# Patient Record
Sex: Female | Born: 1955 | Race: White | Hispanic: No | Marital: Single | Smoking: Never smoker
Health system: Southern US, Community
[De-identification: ages and names within clinical notes are randomized; demographics above are authoritative.]

## PROBLEM LIST (undated history)

## (undated) DIAGNOSIS — E119 Type 2 diabetes mellitus without complications: Secondary | ICD-10-CM

## (undated) DIAGNOSIS — I10 Essential (primary) hypertension: Secondary | ICD-10-CM

## (undated) DIAGNOSIS — N289 Disorder of kidney and ureter, unspecified: Secondary | ICD-10-CM

## (undated) DIAGNOSIS — E079 Disorder of thyroid, unspecified: Secondary | ICD-10-CM

## (undated) HISTORY — PX: CHOLECYSTECTOMY: SHX55

## (undated) HISTORY — PX: REPLACEMENT TOTAL KNEE: SUR1224

## (undated) HISTORY — PX: CARDIAC SURGERY: SHX584

## (undated) HISTORY — PX: OOPHORECTOMY: SHX86

---

## 2021-04-22 ENCOUNTER — Other Ambulatory Visit: Payer: Self-pay | Admitting: Family Medicine

## 2021-04-22 DIAGNOSIS — Z1231 Encounter for screening mammogram for malignant neoplasm of breast: Secondary | ICD-10-CM

## 2021-05-07 ENCOUNTER — Other Ambulatory Visit: Payer: Self-pay

## 2021-05-07 ENCOUNTER — Ambulatory Visit
Admission: RE | Admit: 2021-05-07 | Discharge: 2021-05-07 | Disposition: A | Discharge status: Home / Self Care | Payer: Medicare PPO | Source: Ambulatory Visit | Attending: Family Medicine | Admitting: Family Medicine

## 2021-05-07 DIAGNOSIS — Z1231 Encounter for screening mammogram for malignant neoplasm of breast: Secondary | ICD-10-CM | POA: Insufficient documentation

## 2021-06-23 ENCOUNTER — Emergency Department: Payer: Medicare PPO

## 2021-06-23 ENCOUNTER — Other Ambulatory Visit: Payer: Self-pay

## 2021-06-23 ENCOUNTER — Emergency Department
Admission: EM | Admit: 2021-06-23 | Discharge: 2021-06-23 | Disposition: A | Discharge status: Home / Self Care | Payer: Medicare PPO | Attending: Emergency Medicine | Admitting: Emergency Medicine

## 2021-06-23 DIAGNOSIS — E1122 Type 2 diabetes mellitus with diabetic chronic kidney disease: Secondary | ICD-10-CM | POA: Diagnosis not present

## 2021-06-23 DIAGNOSIS — I2510 Atherosclerotic heart disease of native coronary artery without angina pectoris: Secondary | ICD-10-CM | POA: Insufficient documentation

## 2021-06-23 DIAGNOSIS — Z7901 Long term (current) use of anticoagulants: Secondary | ICD-10-CM | POA: Insufficient documentation

## 2021-06-23 DIAGNOSIS — R197 Diarrhea, unspecified: Secondary | ICD-10-CM | POA: Insufficient documentation

## 2021-06-23 DIAGNOSIS — Z20822 Contact with and (suspected) exposure to covid-19: Secondary | ICD-10-CM | POA: Insufficient documentation

## 2021-06-23 DIAGNOSIS — Z951 Presence of aortocoronary bypass graft: Secondary | ICD-10-CM | POA: Insufficient documentation

## 2021-06-23 DIAGNOSIS — R778 Other specified abnormalities of plasma proteins: Secondary | ICD-10-CM | POA: Diagnosis not present

## 2021-06-23 DIAGNOSIS — N189 Chronic kidney disease, unspecified: Secondary | ICD-10-CM | POA: Insufficient documentation

## 2021-06-23 DIAGNOSIS — Z7982 Long term (current) use of aspirin: Secondary | ICD-10-CM | POA: Diagnosis not present

## 2021-06-23 DIAGNOSIS — R0789 Other chest pain: Secondary | ICD-10-CM | POA: Insufficient documentation

## 2021-06-23 DIAGNOSIS — E860 Dehydration: Secondary | ICD-10-CM | POA: Diagnosis not present

## 2021-06-23 DIAGNOSIS — R42 Dizziness and giddiness: Secondary | ICD-10-CM

## 2021-06-23 DIAGNOSIS — Z79899 Other long term (current) drug therapy: Secondary | ICD-10-CM | POA: Diagnosis not present

## 2021-06-23 DIAGNOSIS — E039 Hypothyroidism, unspecified: Secondary | ICD-10-CM | POA: Diagnosis not present

## 2021-06-23 DIAGNOSIS — R112 Nausea with vomiting, unspecified: Secondary | ICD-10-CM | POA: Diagnosis not present

## 2021-06-23 DIAGNOSIS — I4891 Unspecified atrial fibrillation: Secondary | ICD-10-CM | POA: Diagnosis not present

## 2021-06-23 LAB — URINALYSIS, ROUTINE W REFLEX MICROSCOPIC
Bilirubin Urine: NEGATIVE
Glucose, UA: NEGATIVE mg/dL
Hgb urine dipstick: NEGATIVE
Ketones, ur: NEGATIVE mg/dL
Nitrite: NEGATIVE
Protein, ur: NEGATIVE mg/dL
Specific Gravity, Urine: 1.013 (ref 1.005–1.030)
pH: 5 (ref 5.0–8.0)

## 2021-06-23 LAB — CBC
HCT: 37.4 % (ref 36.0–46.0)
Hemoglobin: 12.5 g/dL (ref 12.0–15.0)
MCH: 30.4 pg (ref 26.0–34.0)
MCHC: 33.4 g/dL (ref 30.0–36.0)
MCV: 91 fL (ref 80.0–100.0)
Platelets: 232 10*3/uL (ref 150–400)
RBC: 4.11 MIL/uL (ref 3.87–5.11)
RDW: 13.8 % (ref 11.5–15.5)
WBC: 9.1 10*3/uL (ref 4.0–10.5)
nRBC: 0 % (ref 0.0–0.2)

## 2021-06-23 LAB — BASIC METABOLIC PANEL
Anion gap: 9 (ref 5–15)
BUN: 63 mg/dL — ABNORMAL HIGH (ref 8–23)
CO2: 28 mmol/L (ref 22–32)
Calcium: 9.5 mg/dL (ref 8.9–10.3)
Chloride: 103 mmol/L (ref 98–111)
Creatinine, Ser: 2.11 mg/dL — ABNORMAL HIGH (ref 0.44–1.00)
GFR, Estimated: 25 mL/min — ABNORMAL LOW (ref >60)
Glucose, Bld: 181 mg/dL — ABNORMAL HIGH (ref 70–99)
Potassium: 4.5 mmol/L (ref 3.5–5.1)
Sodium: 140 mmol/L (ref 135–145)

## 2021-06-23 LAB — RESP PANEL BY RT-PCR (FLU A&B, COVID) ARPGX2
Influenza A by PCR: NEGATIVE
Influenza B by PCR: NEGATIVE
SARS Coronavirus 2 by RT PCR: NEGATIVE

## 2021-06-23 LAB — PROTIME-INR
INR: 2 — ABNORMAL HIGH (ref 0.8–1.2)
Prothrombin Time: 22.6 seconds — ABNORMAL HIGH (ref 11.4–15.2)

## 2021-06-23 LAB — HEPATIC FUNCTION PANEL
ALT: 31 U/L (ref 0–44)
AST: 28 U/L (ref 15–41)
Albumin: 4.1 g/dL (ref 3.5–5.0)
Alkaline Phosphatase: 99 U/L (ref 38–126)
Bilirubin, Direct: 0.1 mg/dL (ref 0.0–0.2)
Indirect Bilirubin: 1 mg/dL — ABNORMAL HIGH (ref 0.3–0.9)
Total Bilirubin: 1.1 mg/dL (ref 0.3–1.2)
Total Protein: 7.4 g/dL (ref 6.5–8.1)

## 2021-06-23 LAB — TROPONIN I (HIGH SENSITIVITY)
Troponin I (High Sensitivity): 43 ng/L — ABNORMAL HIGH (ref <18)
Troponin I (High Sensitivity): 44 ng/L — ABNORMAL HIGH (ref <18)

## 2021-06-23 LAB — LIPASE, BLOOD: Lipase: 54 U/L — ABNORMAL HIGH (ref 11–51)

## 2021-06-23 MED ORDER — ONDANSETRON HCL 4 MG PO TABS: 4.0000 mg | ORAL_TABLET | Freq: Three times a day (TID) | ORAL | 0 refills | Status: AC | PRN

## 2021-06-23 MED ORDER — LACTATED RINGERS IV BOLUS
500.0000 mL | Freq: Once | INTRAVENOUS | Status: AC
  Administered 2021-06-23: 500 mL via INTRAVENOUS

## 2021-06-23 NOTE — ED Notes (Addendum)
See triage note. Pt reports 1 episode of vomiting Saturday; states has been nauseous, dizzy, and off-balance. Denies SOB, CP, HA, abdominal pain. Speech clear. Visitor remains at bedside. Reports BM and urination at baseline though has been drinking and eating a little less than normal the past few days.

## 2021-06-23 NOTE — ED Provider Notes (Signed)
[facility] Emergency Department Provider Note  ____________________________________________   Event Date/Time   First MD Initiated Contact with Patient 06/23/21 1140     (approximate)  I have reviewed the triage vital signs and the nursing notes.   HISTORY  Chief Complaint Dizziness, Nausea, and Gait Problem   HPI Rachel English is a 65 y.o. female with past medical history of DM, CAD s/p CABG, atrial fibrillation and valvular disease s/p replacement on Coumadin, hypothyroidism, CKD with baseline creatinine around 1.8 in March of this year, HL, GERD and chronic left cervical spastic torticollis who presents for assessment of 3 to 4 days of some dizziness and difficulty walking steadily.  Patient states that 2 days ago she had some chest pressure seem to resolve on own without time she also had some vomiting and little diarrhea.  She denies any new fevers, cough, shortness of breath, abdominal pain, burning with urination, back pain, headache, vision changes or injuries or falls.  States has been compliant with her medicines well has not had much to eat or drink the last couple days.  She notes her Lasix was recently decreased due to concerns her kidney function had worsened.  No other acute concerns at this time.         No past medical history on file.  There are no problems to display for this patient.     Prior to Admission medications   Medication Sig Start Date End Date Taking? Authorizing Provider  atorvastatin (LIPITOR) 40 MG tablet Take 40 mg by mouth daily.   Yes [provider]  b complex vitamins capsule Take 1 capsule by mouth daily.   Yes [provider]  Calcium Carbonate Antacid (CALCIUM CARBONATE PO) Take by mouth.   Yes [provider]  ezetimibe (ZETIA) 10 MG tablet Take 10 mg by mouth daily.   Yes [provider]  ferrous sulfate 324 MG TBEC Take 324 mg by mouth.   Yes [provider]   levothyroxine (SYNTHROID) 112 MCG tablet Take 112 mcg by mouth daily before breakfast.   Yes [provider]  metoprolol succinate (TOPROL-XL) 25 MG 24 hr tablet Take 12.5 mg by mouth at bedtime.   Yes [provider]  Multiple Vitamin (MULTIVITAMIN) tablet Take 1 tablet by mouth daily.   Yes [provider]  OMEGA 3-6-9 FATTY ACIDS PO Take 1 tablet by mouth daily.   Yes [provider]  omeprazole (PRILOSEC) 20 MG capsule Take 20 mg by mouth daily.   Yes [provider]  ondansetron (ZOFRAN) 4 MG tablet Take 1 tablet (4 mg total) by mouth every 8 (eight) hours as needed for up to 10 doses for nausea or vomiting. 06/23/21  Yes Gilles Chiquito, MD  STUDY - ASPIRE - aspirin 81 mg or placebo tablet (PI-Sethi) Take 1 tablet by mouth at bedtime. 05/24/11  Yes [provider]  valsartan (DIOVAN) 160 MG tablet Take 80 mg by mouth daily. Taking 0.5 tablet every morning   Yes [provider]  VITAMIN D PO Take by mouth.   Yes [provider]  warfarin (COUMADIN) 5 MG tablet Take 5 mg by mouth daily. Take 1 tablet mondays, wednesdays, fridays, and sundays. Take 0.5 tablet tuesdays, thursdays, and saturdays   Yes [provider]    Allergies Patient has no known allergies.  Family History  Problem Relation Age of Onset   Breast cancer Maternal Aunt     Social History  Review of Systems  Review of Systems  Constitutional:  Negative for chills and fever.  HENT:  Negative for sore throat.   Eyes:  Negative for pain.  Respiratory:  Negative for cough and stridor.   Cardiovascular:  Positive for chest pain (2 days ago, none today).  Gastrointestinal:  Positive for diarrhea (2 days ago, not today) and vomiting (2 days ago, not today]).  Genitourinary:  Negative for dysuria.  Musculoskeletal:  Negative for myalgias.  Skin:  Negative for rash.  Neurological:  Positive for dizziness. Negative for seizures, loss of  consciousness and headaches.  Psychiatric/Behavioral:  Negative for suicidal ideas.   All other systems reviewed and are negative.    ____________________________________________   PHYSICAL EXAM:  VITAL SIGNS: ED Triage Vitals [06/23/21 1024]  Enc Vitals Group     BP (!) 146/65     Pulse Rate 67     Resp 16     Temp 98.2 F (36.8 C)     Temp Source Oral     SpO2 100 %     Weight 175 lb (79.4 kg)     Height 5\' 4"  (1.626 m)     Head Circumference      Peak Flow      Pain Score 8     Pain Loc      Pain Edu?      Excl. in GC?    Vitals:   06/23/21 1244 06/23/21 1426  BP: (!) 132/54   Pulse: 60   Resp: 11 19  Temp:    SpO2: 100% 100%   Physical Exam Vitals and nursing note reviewed.  Constitutional:      General: She is not in acute distress.    Appearance: She is well-developed.  HENT:     Head: Normocephalic and atraumatic.     Right Ear: External ear normal.     Left Ear: External ear normal.     Nose: Nose normal.  Eyes:     Conjunctiva/sclera: Conjunctivae normal.  Cardiovascular:     Rate and Rhythm: Normal rate and regular rhythm.     Heart sounds: No murmur heard. Pulmonary:     Effort: Pulmonary effort is normal. No respiratory distress.     Breath sounds: Normal breath sounds.  Abdominal:     Palpations: Abdomen is soft.     Tenderness: There is no abdominal tenderness.  Musculoskeletal:     Cervical back: Neck supple.  Skin:    General: Skin is warm and dry.  Neurological:     Mental Status: She is alert and oriented to person, place, and time.     Cranial Nerves: No cranial nerve deficit.     Sensory: No sensory deficit.  Psychiatric:        Mood and Affect: Mood normal.    Patient is found to have some left-sided cervical spasticity causing her to hold her head deviated to the left.  Cranial nerves II to XII are otherwise grossly intact.  No pronator.  No finger dysmetria. ____________________________________________   LABS (all labs  ordered are listed, but only abnormal results are displayed)  Labs Reviewed  BASIC METABOLIC PANEL - Abnormal; Notable for the following components:      Result Value   Glucose, Bld 181 (*)    BUN 63 (*)    Creatinine, Ser 2.11 (*)    GFR, Estimated 25 (*)    All other components within normal limits  URINALYSIS, ROUTINE W REFLEX MICROSCOPIC - Abnormal;  Notable for the following components:   Color, Urine YELLOW (*)    APPearance HAZY (*)    Leukocytes,Ua SMALL (*)    Bacteria, UA RARE (*)    All other components within normal limits  HEPATIC FUNCTION PANEL - Abnormal; Notable for the following components:   Indirect Bilirubin 1.0 (*)    All other components within normal limits  LIPASE, BLOOD - Abnormal; Notable for the following components:   Lipase 54 (*)    All other components within normal limits  PROTIME-INR - Abnormal; Notable for the following components:   Prothrombin Time 22.6 (*)    INR 2.0 (*)    All other components within normal limits  TROPONIN I (HIGH SENSITIVITY) - Abnormal; Notable for the following components:   Troponin I (High Sensitivity) 43 (*)    All other components within normal limits  TROPONIN I (HIGH SENSITIVITY) - Abnormal; Notable for the following components:   Troponin I (High Sensitivity) 44 (*)    All other components within normal limits  RESP PANEL BY RT-PCR (FLU A&B, COVID) ARPGX2  URINE CULTURE  CBC  CBG MONITORING, ED   ____________________________________________  EKG  ECG shows sinus rhythm with a ventricular rate of 67, right axis deviation, and nonspecific T wave change in V2 without other clear evidence of acute ischemia or significant arrhythmia. ____________________________________________  RADIOLOGY  ED MD interpretation: CT head without evidence of subacute CVA, edema, mass-effect, hemorrhage or other acute intracranial process.   MR brain without evidence of CVA but does show some chronic microvascular ischemic disease  and very remote tiny cerebellar lacunar infarct.  No other acute intracranial process noted.  Chest x-ray shows no focal consolidation, effusion, edema, pneumothorax or any other acute thoracic process.  Official radiology report(s): DG Chest 2 View  Result Date: 06/23/2021 CLINICAL DATA:  Chest pain. EXAM: CHEST - 2 VIEW COMPARISON:  None. FINDINGS: The heart size and mediastinal contours are within normal limits. Both lungs are clear. Status post aortic, mitral and tricuspid valve repair. The visualized skeletal structures are unremarkable. IMPRESSION: No active cardiopulmonary disease. Electronically Signed   By: Lupita Raider M.D.   On: 06/23/2021 12:47   CT HEAD WO CONTRAST  Result Date: 06/23/2021 CLINICAL DATA:  Lightheadedness and nausea EXAM: CT HEAD WITHOUT CONTRAST TECHNIQUE: Contiguous axial images were obtained from the base of the skull through the vertex without intravenous contrast. COMPARISON:  None. FINDINGS: Brain: No significant volume loss. Preservation of gray white matter junction. No evidence of acute large vascular territory infarction, hemorrhage, hydrocephalus, extra-axial collection or mass lesion/mass effect. Vascular: No hyperdense vessel. Atherosclerotic calcifications of the internal carotid arteries at skull base Skull: Normal. Negative for fracture or focal lesion. Sinuses/Orbits: Visualized portions of the paranasal sinuses and mastoid air cells are predominantly clear. Orbits are unremarkable. Other: None IMPRESSION: No acute intracranial findings. Electronically Signed   By: Maudry Mayhew M.D.   On: 06/23/2021 11:27   MR BRAIN WO CONTRAST  Result Date: 06/23/2021 CLINICAL DATA:  Seizure, abnormal neuro exam EXAM: MRI HEAD WITHOUT CONTRAST TECHNIQUE: Multiplanar, multiecho pulse sequences of the brain and surrounding structures were obtained without intravenous contrast. COMPARISON:  CT head June 23, 2021. FINDINGS: Motion limited exam. Brain: Motion  limited diffusion imaging without evidence of acute infarct. No evidence of acute, hemorrhage, hydrocephalus, extra-axial collection or mass lesion. Mild scattered T2 hyperintensities in the white matter, nonspecific but compatible with chronic microvascular ischemic disease. Tiny remote cerebellar lacunar infarcts bilaterally. Small dilated perivascular  spaces in the inferior basal ganglia bilaterally. The hippocampi appear symmetric in size/signal. Vascular: Major arterial flow voids are maintained at the skull base. Skull and upper cervical spine: Normal marrow signal. Sinuses/Orbits: Clear sinuses.  Unremarkable orbits. Other: No mastoid effusions. IMPRESSION: 1. No evidence of acute intracranial abnormality on this motion limited exam. 2. Mild chronic microvascular ischemic disease and tiny remote cerebellar lacunar infarcts. Electronically Signed   By: Feliberto Harts M.D.   On: 06/23/2021 13:40    ____________________________________________   PROCEDURES  Procedure(s) performed (including Critical Care):  Procedures   ____________________________________________   INITIAL IMPRESSION / ASSESSMENT AND PLAN / ED COURSE     Patient presents with above-stated history exam for assessment of unsteadiness on her feet and dizziness in the setting of some chest pressure vomiting diarrhea 2 days ago.  Patient notes that she has had little to eat or drink and has had poor appetite.  No falls or injuries, vision changes or focal weakness.  On arrival she is hypertensive with BP of 146/65 with otherwise stable vital signs on room air.  Differential includes peripheral vertigo, CVA, metabolic derangements, dehydration possible orthostasis with regards to her difficulty walking steadily.  Unclear if this was precipitated by the chest pressure nausea and vomiting 2 days ago.  Differential for this includes  ACS, myocarditis, arrhythmia, anemia, metabolic derangements, anemia and CHF.  EKG with some  nonspecific changes as described above.  Troponin is slightly elevated at 43 and 44 given stability with patient denying any active chest pain I have a low suspicion for acute ACS.  It is possible she has some chronic troponinemia related to her kidney function I do not believe she requires anticoagulation at this time as she is compliant with her Coumadin and her INR is appropriate.  This also makes PE much less likely at this time.  CT head without evidence of subacute CVA, edema, mass-effect, hemorrhage or other acute intracranial process.   MR brain is negative for evidence of CVA.Marland Kitchen  Chest x-ray shows no focal consolidation, effusion, edema, pneumothorax or any other acute thoracic process.  BMP remarkable for BUN of 63 and a creatinine of 2.11.  No other significant electrolyte metabolic derangements.  This compared to CMP on 10/6 that showed a BUN of 60 and a creatinine of 2.2.  Lipase today 54 not consistent with acute pancreatitis.  Hepatic function panel without evidence of hepatitis or cholestasis.  Overall I suspect patient may be symptomatic from some mild dehydration in the setting of possibly an acute GI illness couple days ago.  Patient was able to ambulate with assistance which she states is baseline and she is supposed to be using a walker at home due to some neuropathy.  She states she has no chest pain or nausea today and has been tolerating p.o.  She does note her daughter also had been sick with some GI symptoms she is wondering if she could have gotten same thing.  Unclear at this time although given stable vitals with eyes reassuring exam work-up and patient at her neurological baseline and able to ambulate and tolerating p.o. I think she is stable for discharge with close outpatient follow-up.  Discharged stable condition.  Strict return precautions advised and discussed.  Emphasized importance of oral hydration and having her kidney function rechecked in 2 or 3 days.       ____________________________________________   FINAL CLINICAL IMPRESSION(S) / ED DIAGNOSES  Final diagnoses:  Dehydration  Troponin I above reference range  Dizziness  Nausea and vomiting, unspecified vomiting type    Medications  lactated ringers bolus 500 mL (0 mLs Intravenous Stopped 06/23/21 1336)     ED Discharge Orders          Ordered    ondansetron (ZOFRAN) 4 MG tablet  Every 8 hours PRN        06/23/21 1424             Note:  This document was prepared using Dragon voice recognition software and may include unintentional dictation errors.    Gilles Chiquito, MD 06/23/21 [id number]

## 2021-06-23 NOTE — ED Notes (Signed)
Pt signed printed d/c paperwork.  

## 2021-06-23 NOTE — ED Notes (Addendum)
This RN walked hallway with pt; pt maintained 100% O2 sat on RA and was steady. Pt denied SOB; was in NAD. Pt reports has cane at home and uses it as needed; states it is needed rarely.

## 2021-06-23 NOTE — ED Notes (Signed)
Pt leaving for imaging. This RN looking for IV pump while pt off unit.

## 2021-06-23 NOTE — ED Notes (Signed)
Blue tube sent to lab 

## 2021-06-23 NOTE — ED Notes (Addendum)
See triage note. EDP Smith at bedside. Pt able to stand steadily; skin dry; resp reg/unlabored; calm. Pt denies CP, SOB, HA. Visitor at bedside with pt.

## 2021-06-23 NOTE — ED Triage Notes (Signed)
Pt states that she has been feeling lightheaded and nauseated since Friday and reports unsteady gait as well, pt states that she is a heart patient and reports that she has had two open heart surgeries and states that she never experienced pain with those

## 2021-06-24 LAB — URINE CULTURE: Culture: <10000 — AB

## 2021-11-04 ENCOUNTER — Other Ambulatory Visit: Payer: Self-pay

## 2021-11-04 ENCOUNTER — Emergency Department (HOSPITAL_COMMUNITY)
Admission: EM | Admit: 2021-11-04 | Discharge: 2021-11-05 | Disposition: A | Discharge status: Home / Self Care | Payer: Medicare PPO | Attending: Emergency Medicine | Admitting: Emergency Medicine

## 2021-11-04 ENCOUNTER — Encounter (HOSPITAL_COMMUNITY): Payer: Self-pay | Admitting: Emergency Medicine

## 2021-11-04 DIAGNOSIS — Z79899 Other long term (current) drug therapy: Secondary | ICD-10-CM | POA: Insufficient documentation

## 2021-11-04 DIAGNOSIS — R040 Epistaxis: Secondary | ICD-10-CM | POA: Insufficient documentation

## 2021-11-04 DIAGNOSIS — Z7901 Long term (current) use of anticoagulants: Secondary | ICD-10-CM | POA: Insufficient documentation

## 2021-11-04 HISTORY — DX: Disorder of kidney and ureter, unspecified: N28.9

## 2021-11-04 HISTORY — DX: Type 2 diabetes mellitus without complications: E11.9

## 2021-11-04 HISTORY — DX: Essential (primary) hypertension: I10

## 2021-11-04 HISTORY — DX: Disorder of thyroid, unspecified: E07.9

## 2021-11-04 NOTE — ED Triage Notes (Signed)
Pt c/o epistaxis for several hours. Pt takes warfarin. Pt states that the bleeding is slowing down.   ?

## 2021-11-04 NOTE — ED Notes (Signed)
Pt instructed to hold tissue to nose and apply pressure. Instructed to NOT place finger and tissue inside of nose, refrain from blotting as this will decrease chances of clotting. Bleeding is controlled at this time. ?

## 2021-11-05 LAB — CBC
HCT: 36.2 % (ref 36.0–46.0)
Hemoglobin: 11.3 g/dL — ABNORMAL LOW (ref 12.0–15.0)
MCH: 29 pg (ref 26.0–34.0)
MCHC: 31.2 g/dL (ref 30.0–36.0)
MCV: 92.8 fL (ref 80.0–100.0)
Platelets: 211 10*3/uL (ref 150–400)
RBC: 3.9 MIL/uL (ref 3.87–5.11)
RDW: 14.8 % (ref 11.5–15.5)
WBC: 9.7 10*3/uL (ref 4.0–10.5)
nRBC: 0 % (ref 0.0–0.2)

## 2021-11-05 LAB — PROTIME-INR
INR: 4.4 (ref 0.8–1.2)
Prothrombin Time: 42.1 seconds — ABNORMAL HIGH (ref 11.4–15.2)

## 2021-11-05 NOTE — ED Notes (Signed)
Date and time results received: 11/05/21 0100 ?(use smartphrase ".now" to insert current time) ? ?Test: INR ?Critical Value: 4.4 ? ?Name of Provider Notified: Blinda Leatherwood, MD ? ?Orders Received? Or Actions Taken?:  n/a ?

## 2021-11-05 NOTE — Discharge Instructions (Addendum)
Hold 1 dose of Coumadin.  Do not use the nasal spray for the next couple of days, you may purchase saline spray to use which is over-the-counter.  Be careful blowing your nose, try not to forcefully blow it for the next couple of days.  If you start bleeding heavily, return for [place] to reevaluate and possibly pack the nose. ? ?Call [phone] for ENT follow-up. ?

## 2021-11-05 NOTE — ED Provider Notes (Signed)
?[facility] ?Provider Note ? ? ?CSN: [id number] ?Arrival date & time: 11/04/21  2212 ? ?  ? ?History ? ?Chief Complaint  ?Patient presents with  ? Epistaxis  ? ? ?Rachel English is a 66 y.o. female. ? ?Presents to the emergency department for evaluation of nosebleed.  Patient does have a history of recurrent nosebleeds.  She reports that she can usually stop with pressure but this 1 has been bleeding on and off today.  Patient is on Coumadin because of previous valve replacement.  She has not had her INR checked in 2 weeks, scheduled for check tomorrow.  Patient denies any direct trauma but she does have increased allergies this year, uses Flonase and blows her nose quite a bit. ? ? ?  ? ?Home Medications ?Prior to Admission medications   ?Medication Sig Start Date End Date Taking? Authorizing Provider  ?atorvastatin (LIPITOR) 40 MG tablet Take 40 mg by mouth daily.    [provider]  ?b complex vitamins capsule Take 1 capsule by mouth daily.    [provider]  ?Calcium Carbonate Antacid (CALCIUM CARBONATE PO) Take by mouth.    [provider]  ?ezetimibe (ZETIA) 10 MG tablet Take 10 mg by mouth daily.    [provider]  ?ferrous sulfate 324 MG TBEC Take 324 mg by mouth.    [provider]  ?levothyroxine (SYNTHROID) 112 MCG tablet Take 112 mcg by mouth daily before breakfast.    [provider]  ?metoprolol succinate (TOPROL-XL) 25 MG 24 hr tablet Take 12.5 mg by mouth at bedtime.    [provider]  ?Multiple Vitamin (MULTIVITAMIN) tablet Take 1 tablet by mouth daily.    [provider]  ?OMEGA 3-6-9 FATTY ACIDS PO Take 1 tablet by mouth daily.    [provider]  ?omeprazole (PRILOSEC) 20 MG capsule Take 20 mg by mouth daily.    [provider]  ?ondansetron (ZOFRAN) 4 MG tablet Take 1 tablet (4 mg total) by mouth every 8 (eight) hours as needed for up to 10 doses for nausea or vomiting. 06/23/21    Lucrezia Starch, MD  ?STUDY - ASPIRE - aspirin 81 mg or placebo tablet (PI-Sethi) Take 1 tablet by mouth at bedtime. 05/24/11   [provider]  ?valsartan (DIOVAN) 160 MG tablet Take 80 mg by mouth daily. Taking 0.5 tablet every morning    [provider]  ?VITAMIN D PO Take by mouth.    [provider]  ?warfarin (COUMADIN) 5 MG tablet Take 5 mg by mouth daily. Take 1 tablet mondays, wednesdays, fridays, and sundays. Take 0.5 tablet tuesdays, thursdays, and saturdays    [provider]  ?   ? ?Allergies    ?Patient has no known allergies.   ? ?Review of Systems   ?Review of Systems  ?HENT:  Positive for congestion and nosebleeds.   ? ?Physical Exam ?Updated Vital Signs ?BP (!) 128/51   Pulse 77   Temp 98 ?F (36.7 ?C)   Resp 17   Ht 5\' 4"  (1.626 m)   Wt 81.6 kg   SpO2 96%   BMI 30.90 kg/m?  ?Physical Exam ?Vitals and nursing note reviewed.  ?Constitutional:   ?   General: She is not in acute distress. ?   Appearance: She is well-developed.  ?HENT:  ?   Head: Normocephalic and atraumatic.  ?   Nose: Mucosal edema and congestion present.  ?   Comments: Blood in both  nares, no active bleeding ?   Mouth/Throat:  ?   Mouth: Mucous membranes are moist.  ?Eyes:  ?   General: Vision grossly intact. Gaze aligned appropriately.  ?   Extraocular Movements: Extraocular movements intact.  ?   Conjunctiva/sclera: Conjunctivae normal.  ?Cardiovascular:  ?   Rate and Rhythm: Normal rate and regular rhythm.  ?   Pulses: Normal pulses.  ?   Heart sounds: Normal heart sounds, S1 normal and S2 normal. No murmur heard. ?  No friction rub. No gallop.  ?Pulmonary:  ?   Effort: Pulmonary effort is normal. No respiratory distress.  ?   Breath sounds: Normal breath sounds.  ?Abdominal:  ?   General: Bowel sounds are normal.  ?   Palpations: Abdomen is soft.  ?   Tenderness: There is no abdominal tenderness. There is no guarding or rebound.  ?   Hernia: No hernia is present.  ?Musculoskeletal:     ?    General: No swelling.  ?   Cervical back: Full passive range of motion without pain, normal range of motion and neck supple. No spinous process tenderness or muscular tenderness. Normal range of motion.  ?   Right lower leg: No edema.  ?   Left lower leg: No edema.  ?Skin: ?   General: Skin is warm and dry.  ?   Capillary Refill: Capillary refill takes less than 2 seconds.  ?   Findings: No ecchymosis, erythema, rash or wound.  ?Neurological:  ?   General: No focal deficit present.  ?   Mental Status: She is alert and oriented to person, place, and time.  ?   GCS: GCS eye subscore is 4. GCS verbal subscore is 5. GCS motor subscore is 6.  ?   Cranial Nerves: Cranial nerves 2-12 are intact.  ?   Sensory: Sensation is intact.  ?   Motor: Motor function is intact.  ?   Coordination: Coordination is intact.  ?Psychiatric:     ?   Attention and Perception: Attention normal.     ?   Mood and Affect: Mood normal.     ?   Speech: Speech normal.     ?   Behavior: Behavior normal.  ? ? ?ED Results / Procedures / Treatments   ?Labs ?(all labs ordered are listed, but only abnormal results are displayed) ?Labs Reviewed  ?CBC - Abnormal; Notable for the following components:  ?    Result Value  ? Hemoglobin 11.3 (*)   ? All other components within normal limits  ?PROTIME-INR - Abnormal; Notable for the following components:  ? Prothrombin Time 42.1 (*)   ? INR 4.4 (*)   ? All other components within normal limits  ? ? ?EKG ?None ? ?Radiology ?No results found. ? ?Procedures ?Procedures  ? ? ?Medications Ordered in ED ?Medications - No data to display ? ?ED Course/ Medical Decision Making/ A&P ?  ?                        ?Medical Decision Making ?Amount and/or Complexity of Data Reviewed ?Labs: ordered. ? ? ?Patient presents to the ER for evaluation of nosebleed.  Patient reports that she does have a history of recurrent nosebleeds but usually they stop.  Patient is on Coumadin because of valve replacement. ? ?Patient's INR is  supratherapeutic at 4.2.  We will hold 1 dose. ? ?As far as the bleeding goes tonight, she is not  actively heavily bleeding.  She keeps dabbing and blowing her nose and there is a small amount of blood associated when she does this, but there is no dripping of the blood.  She has been monitored for some time.  I do not see a bleeding source, there is blood in both sides of her nose but no active bleeding currently. ? ?Patient uses some type of nasal spray for allergies.  We will have her hold that for the next few days.  She can take her Singulair and/or Claritin for allergies.  May use saline nasal spray.  Return for increased bleeding. ? ? ? ? ? ? ? ?Final Clinical Impression(s) / ED Diagnoses ?Final diagnoses:  ?Epistaxis  ? ? ?Rx / DC Orders ?ED Discharge Orders   ? ? None  ? ?  ? ? ?  ?Orpah Greek, MD ?11/05/21 [phone] ? ?

## 2022-04-01 ENCOUNTER — Other Ambulatory Visit: Payer: Self-pay | Admitting: Family Medicine

## 2022-04-01 DIAGNOSIS — Z1231 Encounter for screening mammogram for malignant neoplasm of breast: Secondary | ICD-10-CM

## 2022-05-08 ENCOUNTER — Ambulatory Visit
Admission: RE | Admit: 2022-05-08 | Discharge: 2022-05-08 | Disposition: A | Discharge status: Home / Self Care | Payer: Medicare PPO | Source: Ambulatory Visit | Attending: Family Medicine | Admitting: Family Medicine

## 2022-05-08 DIAGNOSIS — Z1231 Encounter for screening mammogram for malignant neoplasm of breast: Secondary | ICD-10-CM | POA: Diagnosis present

## 2022-09-24 ENCOUNTER — Ambulatory Visit (INDEPENDENT_AMBULATORY_CARE_PROVIDER_SITE_OTHER): Payer: Medicare PPO | Admitting: Nurse Practitioner

## 2022-09-24 ENCOUNTER — Encounter (INDEPENDENT_AMBULATORY_CARE_PROVIDER_SITE_OTHER): Payer: Self-pay | Admitting: Nurse Practitioner

## 2022-09-24 VITALS — BP 112/54 | HR 68 | Ht 64.0 in | Wt 180.0 lb

## 2022-09-24 DIAGNOSIS — R0989 Other specified symptoms and signs involving the circulatory and respiratory systems: Secondary | ICD-10-CM

## 2022-09-24 DIAGNOSIS — I10 Essential (primary) hypertension: Secondary | ICD-10-CM

## 2022-09-24 DIAGNOSIS — E119 Type 2 diabetes mellitus without complications: Secondary | ICD-10-CM

## 2022-09-24 DIAGNOSIS — R6889 Other general symptoms and signs: Secondary | ICD-10-CM | POA: Diagnosis not present

## 2022-09-26 ENCOUNTER — Encounter (INDEPENDENT_AMBULATORY_CARE_PROVIDER_SITE_OTHER): Payer: Self-pay | Admitting: Nurse Practitioner

## 2022-09-26 NOTE — Progress Notes (Signed)
Subjective:    Patient ID: Rachel English, female    DOB: 1956/07/31, 67 y.o.   MRN: [id number] Chief Complaint  Patient presents with   New Patient (Initial Visit)    Abnormal ABI    Rachel English is a 67 year old female that presents today following abnormal ABIs with leg heaviness and claudication.  The patient notes not being able to walk significant distances without feeling like her legs are heavy and tired.  She notes that the heaviness resolves with rest and its mostly in her thigh area.  She denies classic claudication-like symptoms.  She had ABIs done which were noncompressible bilaterally.  She also had nearly normal toe waveforms bilaterally with biphasic waveforms noted bilaterally.  She notes that the symptoms have been going on for about 6 months or longer.  She denies any open wounds or ulcerations.  She denies any rest pain like sensations.    Review of Systems  Musculoskeletal:  Positive for arthralgias.  Neurological:  Positive for weakness.  All other systems reviewed and are negative.      Objective:   Physical Exam Vitals reviewed.  HENT:     Head: Normocephalic.  Neck:     Vascular: Carotid bruit present.  Cardiovascular:     Rate and Rhythm: Normal rate.     Pulses: Normal pulses.     Heart sounds: Murmur heard.  Pulmonary:     Effort: Pulmonary effort is normal.  Skin:    General: Skin is warm and dry.  Neurological:     Mental Status: She is alert and oriented to person, place, and time.  Psychiatric:        Mood and Affect: Mood normal.        Behavior: Behavior normal.        Thought Content: Thought content normal.        Judgment: Judgment normal.     BP (!) 112/54   Pulse 68   Ht 5\' 4"  (1.626 m)   Wt 180 lb (81.6 kg)   BMI 30.90 kg/m   Past Medical History:  Diagnosis Date   Diabetes mellitus without complication (HCC)    Hypertension    Renal disorder    Thyroid disease     Social History   Socioeconomic History    Marital status: Single    Spouse name: Not on file   Number of children: Not on file   Years of education: Not on file   Highest education level: Not on file  Occupational History   Not on file  Tobacco Use   Smoking status: Never   Smokeless tobacco: Never  Vaping Use   Vaping Use: Never used  Substance and Sexual Activity   Alcohol use: Not Currently   Drug use: Never   Sexual activity: Not Currently  Other Topics Concern   Not on file  Social History Narrative   Not on file   Social Determinants of Health   Financial Resource Strain: Not on file  Food Insecurity: Not on file  Transportation Needs: Not on file  Physical Activity: Not on file  Stress: Not on file  Social Connections: Not on file  Intimate Partner Violence: Not on file    Past Surgical History:  Procedure Laterality Date   CARDIAC SURGERY     CHOLECYSTECTOMY     OOPHORECTOMY Left    REPLACEMENT TOTAL KNEE Left     Family History  Problem Relation Age of Onset   Breast cancer Maternal Aunt  No Known Allergies     Latest Ref Rng & Units 11/05/2021   12:27 AM 06/23/2021   10:37 AM  CBC  WBC 4.0 - 10.5 K/uL 9.7  9.1   Hemoglobin 12.0 - 15.0 g/dL 11.3  12.5   Hematocrit 36.0 - 46.0 % 36.2  37.4   Platelets 150 - 400 K/uL 211  232       CMP     Component Value Date/Time   NA 140 06/23/2021 1037   K 4.5 06/23/2021 1037   CL 103 06/23/2021 1037   CO2 28 06/23/2021 1037   GLUCOSE 181 (H) 06/23/2021 1037   BUN 63 (H) 06/23/2021 1037   CREATININE 2.11 (H) 06/23/2021 1037   CALCIUM 9.5 06/23/2021 1037   PROT 7.4 06/23/2021 1042   ALBUMIN 4.1 06/23/2021 1042   AST 28 06/23/2021 1042   ALT 31 06/23/2021 1042   ALKPHOS 99 06/23/2021 1042   BILITOT 1.1 06/23/2021 1042   GFRNONAA 25 (L) 06/23/2021 1037     No results found.     Assessment & Plan:   1. Abnormal ankle brachial index (ABI) The patient's previous ABIs were indicative of noncompressible ABIs which is likely an  indication of medial calcification.  I had a long discussion with the patient that her symptoms may not be related to peripheral arterial disease  2. Type 2 diabetes mellitus without complication, without long-term current use of insulin (HCC) Continue hypoglycemic medications as already ordered, these medications have been reviewed and there are no changes at this time.  Hgb A1C to be monitored as already arranged by primary service  3. Essential hypertension Continue antihypertensive medications as already ordered, these medications have been reviewed and there are no changes at this time.   4. Bilateral carotid bruits The patient has bilateral carotid bruits we will also evaluate her carotid arteries at follow-up   Current Outpatient Medications on File Prior to Visit  Medication Sig Dispense Refill   atorvastatin (LIPITOR) 40 MG tablet Take 40 mg by mouth daily.     b complex vitamins capsule Take 1 capsule by mouth daily.     Calcium Carbonate Antacid (CALCIUM CARBONATE PO) Take by mouth.     ezetimibe (ZETIA) 10 MG tablet Take 10 mg by mouth daily.     ferrous sulfate 324 MG TBEC Take 324 mg by mouth.     levothyroxine (SYNTHROID) 112 MCG tablet Take 112 mcg by mouth daily before breakfast.     metoprolol succinate (TOPROL-XL) 25 MG 24 hr tablet Take 12.5 mg by mouth at bedtime.     Multiple Vitamin (MULTIVITAMIN) tablet Take 1 tablet by mouth daily.     OMEGA 3-6-9 FATTY ACIDS PO Take 1 tablet by mouth daily.     omeprazole (PRILOSEC) 20 MG capsule Take 20 mg by mouth daily.     ondansetron (ZOFRAN) 4 MG tablet Take 1 tablet (4 mg total) by mouth every 8 (eight) hours as needed for up to 10 doses for nausea or vomiting. 10 tablet 0   saxagliptin HCl (ONGLYZA) 5 MG TABS tablet Take 5 mg by mouth daily.     STUDY - ASPIRE - aspirin 81 mg or placebo tablet (PI-Sethi) Take 1 tablet by mouth at bedtime.     valsartan (DIOVAN) 160 MG tablet Take 80 mg by mouth daily. Taking 0.5 tablet  every morning     VITAMIN D PO Take by mouth.     warfarin (COUMADIN) 5 MG tablet Take 5 mg  by mouth daily. Take 1 tablet mondays, wednesdays, fridays, and sundays. Take 0.5 tablet tuesdays, thursdays, and saturdays     Magnesium 400 MG TABS Take by mouth.     No current facility-administered medications on file prior to visit.    There are no Patient Instructions on file for this visit. No follow-ups on file.   Kris Hartmann, NP

## 2022-09-26 NOTE — Progress Notes (Incomplete)
Subjective:    Patient ID: Rachel English, female    DOB: [birth year], 67 y.o.   MRN: [id number] Chief Complaint  Patient presents with  . New Patient (Initial Visit)    Abnormal ABI    HPI  Review of Systems     Objective:   Physical Exam  BP (!) 112/54   Pulse 68   Ht 5\' 4"  (1.626 m)   Wt 180 lb (81.6 kg)   BMI 30.90 kg/m   Past Medical History:  Diagnosis Date  . Diabetes mellitus without complication ([place])   . Hypertension   . Renal disorder   . Thyroid disease     Social History   Socioeconomic History  . Marital status: Single    Spouse name: Not on file  . Number of children: Not on file  . Years of education: Not on file  . Highest education level: Not on file  Occupational History  . Not on file  Tobacco Use  . Smoking status: Never  . Smokeless tobacco: Never  Vaping Use  . Vaping Use: Never used  Substance and Sexual Activity  . Alcohol use: Not Currently  . Drug use: Never  . Sexual activity: Not Currently  Other Topics Concern  . Not on file  Social History Narrative  . Not on file   Social Determinants of Health   Financial Resource Strain: Not on file  Food Insecurity: Not on file  Transportation Needs: Not on file  Physical Activity: Not on file  Stress: Not on file  Social Connections: Not on file  Intimate Partner Violence: Not on file    Past Surgical History:  Procedure Laterality Date  . CARDIAC SURGERY    . CHOLECYSTECTOMY    . OOPHORECTOMY Left   . REPLACEMENT TOTAL KNEE Left     Family History  Problem Relation Age of Onset  . Breast cancer Maternal Aunt     No Known Allergies     Latest Ref Rng & Units 11/05/2021   12:27 AM 06/23/2021   10:37 AM  CBC  WBC 4.0 - 10.5 K/uL 9.7  9.1   Hemoglobin 12.0 - 15.0 g/dL 11.3  12.5   Hematocrit 36.0 - 46.0 % 36.2  37.4   Platelets 150 - 400 K/uL 211  232       CMP     Component Value Date/Time   NA 140 06/23/2021 1037   K 4.5 06/23/2021 1037   CL 103  06/23/2021 1037   CO2 28 06/23/2021 1037   GLUCOSE 181 (H) 06/23/2021 1037   BUN 63 (H) 06/23/2021 1037   CREATININE 2.11 (H) 06/23/2021 1037   CALCIUM 9.5 06/23/2021 1037   PROT 7.4 06/23/2021 1042   ALBUMIN 4.1 06/23/2021 1042   AST 28 06/23/2021 1042   ALT 31 06/23/2021 1042   ALKPHOS 99 06/23/2021 1042   BILITOT 1.1 06/23/2021 1042   GFRNONAA 25 (L) 06/23/2021 1037     No results found.     Assessment & Plan:   1. Abnormal ankle brachial index (ABI) The patient's previous ABIs were indicative of noncompressible ABIs which is likely an indication of medial calcification.  I had a long discussion with the patient that her symptoms may not be related to peripheral arterial disease  2. Type 2 diabetes mellitus without complication, without long-term current use of insulin (HCC) Continue hypoglycemic medications as already ordered, these medications have been reviewed and there are no changes at this time.  Hgb A1C  to be monitored as already arranged by primary service  3. Essential hypertension Continue antihypertensive medications as already ordered, these medications have been reviewed and there are no changes at this time.   Current Outpatient Medications on File Prior to Visit  Medication Sig Dispense Refill  . atorvastatin (LIPITOR) 40 MG tablet Take 40 mg by mouth daily.    Marland Kitchen b complex vitamins capsule Take 1 capsule by mouth daily.    . Calcium Carbonate Antacid (CALCIUM CARBONATE PO) Take by mouth.    . ezetimibe (ZETIA) 10 MG tablet Take 10 mg by mouth daily.    . ferrous sulfate 324 MG TBEC Take 324 mg by mouth.    . levothyroxine (SYNTHROID) 112 MCG tablet Take 112 mcg by mouth daily before breakfast.    . metoprolol succinate (TOPROL-XL) 25 MG 24 hr tablet Take 12.5 mg by mouth at bedtime.    . Multiple Vitamin (MULTIVITAMIN) tablet Take 1 tablet by mouth daily.    . OMEGA 3-6-9 FATTY ACIDS PO Take 1 tablet by mouth daily.    Marland Kitchen omeprazole (PRILOSEC) 20 MG  capsule Take 20 mg by mouth daily.    . ondansetron (ZOFRAN) 4 MG tablet Take 1 tablet (4 mg total) by mouth every 8 (eight) hours as needed for up to 10 doses for nausea or vomiting. 10 tablet 0  . saxagliptin HCl (ONGLYZA) 5 MG TABS tablet Take 5 mg by mouth daily.    . STUDY - ASPIRE - aspirin 81 mg or placebo tablet (PI-Sethi) Take 1 tablet by mouth at bedtime.    . valsartan (DIOVAN) 160 MG tablet Take 80 mg by mouth daily. Taking 0.5 tablet every morning    . VITAMIN D PO Take by mouth.    . warfarin (COUMADIN) 5 MG tablet Take 5 mg by mouth daily. Take 1 tablet mondays, wednesdays, fridays, and sundays. Take 0.5 tablet tuesdays, thursdays, and saturdays    . Magnesium 400 MG TABS Take by mouth.     No current facility-administered medications on file prior to visit.    There are no Patient Instructions on file for this visit. No follow-ups on file.   Kris Hartmann, NP

## 2022-10-26 ENCOUNTER — Other Ambulatory Visit (INDEPENDENT_AMBULATORY_CARE_PROVIDER_SITE_OTHER): Payer: Self-pay | Admitting: Nurse Practitioner

## 2022-10-26 ENCOUNTER — Encounter (INDEPENDENT_AMBULATORY_CARE_PROVIDER_SITE_OTHER): Payer: Medicare PPO

## 2022-10-26 DIAGNOSIS — R0989 Other specified symptoms and signs involving the circulatory and respiratory systems: Secondary | ICD-10-CM

## 2022-10-26 DIAGNOSIS — R6889 Other general symptoms and signs: Secondary | ICD-10-CM

## 2022-10-28 ENCOUNTER — Ambulatory Visit (INDEPENDENT_AMBULATORY_CARE_PROVIDER_SITE_OTHER): Payer: Medicare PPO | Admitting: Nurse Practitioner

## 2022-10-28 ENCOUNTER — Ambulatory Visit (INDEPENDENT_AMBULATORY_CARE_PROVIDER_SITE_OTHER): Payer: Medicare PPO

## 2022-10-28 ENCOUNTER — Encounter (INDEPENDENT_AMBULATORY_CARE_PROVIDER_SITE_OTHER): Payer: Self-pay | Admitting: Nurse Practitioner

## 2022-10-28 VITALS — BP 119/76 | HR 130 | Resp 16 | Wt 178.8 lb

## 2022-10-28 DIAGNOSIS — I10 Essential (primary) hypertension: Secondary | ICD-10-CM

## 2022-10-28 DIAGNOSIS — R6889 Other general symptoms and signs: Secondary | ICD-10-CM

## 2022-10-28 DIAGNOSIS — E119 Type 2 diabetes mellitus without complications: Secondary | ICD-10-CM | POA: Diagnosis not present

## 2022-10-28 DIAGNOSIS — R0989 Other specified symptoms and signs involving the circulatory and respiratory systems: Secondary | ICD-10-CM

## 2022-10-29 LAB — VAS US ABI WITH/WO TBI
Left ABI: >1
Right ABI: >1

## 2022-11-16 ENCOUNTER — Encounter (INDEPENDENT_AMBULATORY_CARE_PROVIDER_SITE_OTHER): Payer: Self-pay | Admitting: Nurse Practitioner

## 2022-11-16 NOTE — Progress Notes (Signed)
Subjective:    Patient ID: Rachel English, female    DOB: [birth year], 67 y.o.   MRN: [id number] Chief Complaint  Patient presents with   Follow-up    Ultrasound follow up    Charene Wrobleski is a 67 year old female that presents today following abnormal ABIs with leg heaviness and claudication.  The patient notes not being able to walk significant distances without feeling like her legs are heavy and tired.  She notes that the heaviness resolves with rest and its mostly in her thigh area.  She denies classic claudication-like symptoms.  She had ABIs done which were noncompressible bilaterally.  She also had nearly normal toe waveforms bilaterally with biphasic waveforms noted bilaterally.  She notes that the symptoms have been going on for about 6 months or longer.  She denies any open wounds or ulcerations.  She denies any rest pain like sensations.  Today the patient has noncompressible waveforms bilaterally with biphasic waveforms throughout the bilateral lower extremities.  There is no evidence of any obstruction noted within either of the lower extremities.  The patient also underwent aortoiliac duplexes which showed no evidence of significant stenosis within the iliac vessels.  The patient had a 1 to 39% stenosis in the bilateral internal carotid arteries with no significant stenosis of the vertebral arteries.  Normal flow were seen in the bilateral subclavian arteries.    Review of Systems  Musculoskeletal:  Positive for arthralgias.  Neurological:  Positive for weakness.  All other systems reviewed and are negative.      Objective:   Physical Exam Vitals reviewed.  HENT:     Head: Normocephalic.  Neck:     Vascular: Carotid bruit present.  Cardiovascular:     Rate and Rhythm: Normal rate.     Pulses: Normal pulses.     Heart sounds: Murmur heard.  Pulmonary:     Effort: Pulmonary effort is normal.  Skin:    General: Skin is warm and dry.  Neurological:     Mental  Status: She is alert and oriented to person, place, and time.  Psychiatric:        Mood and Affect: Mood normal.        Behavior: Behavior normal.        Thought Content: Thought content normal.        Judgment: Judgment normal.     BP 119/76 (BP Location: Left Arm)   Pulse (!) 130   Resp 16   Wt 178 lb 12.8 oz (81.1 kg)   BMI 30.69 kg/m   Past Medical History:  Diagnosis Date   Diabetes mellitus without complication (HCC)    Hypertension    Renal disorder    Thyroid disease     Social History   Socioeconomic History   Marital status: Single    Spouse name: Not on file   Number of children: Not on file   Years of education: Not on file   Highest education level: Not on file  Occupational History   Not on file  Tobacco Use   Smoking status: Never   Smokeless tobacco: Never  Vaping Use   Vaping Use: Never used  Substance and Sexual Activity   Alcohol use: Not Currently   Drug use: Never   Sexual activity: Not Currently  Other Topics Concern   Not on file  Social History Narrative   Not on file   Social Determinants of Health   Financial Resource Strain: Not on file  Food Insecurity:  Not on file  Transportation Needs: Not on file  Physical Activity: Not on file  Stress: Not on file  Social Connections: Not on file  Intimate Partner Violence: Not on file    Past Surgical History:  Procedure Laterality Date   CARDIAC SURGERY     CHOLECYSTECTOMY     OOPHORECTOMY Left    REPLACEMENT TOTAL KNEE Left     Family History  Problem Relation Age of Onset   Breast cancer Maternal Aunt     No Known Allergies     Latest Ref Rng & Units 11/05/2021   12:27 AM 06/23/2021   10:37 AM  CBC  WBC 4.0 - 10.5 K/uL 9.7  9.1   Hemoglobin 12.0 - 15.0 g/dL 11.3  12.5   Hematocrit 36.0 - 46.0 % 36.2  37.4   Platelets 150 - 400 K/uL 211  232       CMP     Component Value Date/Time   NA 140 06/23/2021 1037   K 4.5 06/23/2021 1037   CL 103 06/23/2021 1037   CO2  28 06/23/2021 1037   GLUCOSE 181 (H) 06/23/2021 1037   BUN 63 (H) 06/23/2021 1037   CREATININE 2.11 (H) 06/23/2021 1037   CALCIUM 9.5 06/23/2021 1037   PROT 7.4 06/23/2021 1042   ALBUMIN 4.1 06/23/2021 1042   AST 28 06/23/2021 1042   ALT 31 06/23/2021 1042   ALKPHOS 99 06/23/2021 1042   BILITOT 1.1 06/23/2021 1042   GFRNONAA 25 (L) 06/23/2021 1037     No results found.     Assessment & Plan:   1. Abnormal ankle brachial index (ABI)  Recommend:  The patient has evidence of atherosclerosis of the lower extremities .  The patient does not voice lifestyle limiting changes at this point in time.  Based upon the patient's description of pain and issues I suspect that is more so related to her degenerative disc disease versus atherosclerosis.  No invasive studies, angiography or surgery at this time The patient should continue walking and begin a more formal exercise program.  The patient should continue antiplatelet therapy and aggressive treatment of the lipid abnormalities  No changes in the patient's medications at this time  Continued surveillance is indicated as atherosclerosis is likely to progress with time.    The patient will continue follow up with noninvasive studies as ordered in 1 year.   2. Type 2 diabetes mellitus without complication, without long-term current use of insulin (HCC) Continue hypoglycemic medications as already ordered, these medications have been reviewed and there are no changes at this time.  Hgb A1C to be monitored as already arranged by primary service  3. Essential hypertension Continue antihypertensive medications as already ordered, these medications have been reviewed and there are no changes at this time.   4. Bilateral carotid bruits Recommend:  Given the patient's asymptomatic subcritical stenosis no further invasive testing or surgery at this time.  Duplex ultrasound shows 1-39% stenosis bilaterally.  Continue antiplatelet  therapy as prescribed Continue management of CAD, HTN and Hyperlipidemia Healthy heart diet,  encouraged exercise at least 4 times per week Follow up in 12 months with duplex ultrasound and physical exam    Current Outpatient Medications on File Prior to Visit  Medication Sig Dispense Refill   atorvastatin (LIPITOR) 40 MG tablet Take 40 mg by mouth daily.     b complex vitamins capsule Take 1 capsule by mouth daily.     Calcium Carbonate Antacid (CALCIUM CARBONATE PO) Take  by mouth.     ezetimibe (ZETIA) 10 MG tablet Take 10 mg by mouth daily.     ferrous sulfate 324 MG TBEC Take 324 mg by mouth.     levothyroxine (SYNTHROID) 112 MCG tablet Take 112 mcg by mouth daily before breakfast.     Magnesium 400 MG TABS Take by mouth.     metoprolol succinate (TOPROL-XL) 25 MG 24 hr tablet Take 12.5 mg by mouth at bedtime.     Multiple Vitamin (MULTIVITAMIN) tablet Take 1 tablet by mouth daily.     OMEGA 3-6-9 FATTY ACIDS PO Take 1 tablet by mouth daily.     omeprazole (PRILOSEC) 20 MG capsule Take 20 mg by mouth daily.     ondansetron (ZOFRAN) 4 MG tablet Take 1 tablet (4 mg total) by mouth every 8 (eight) hours as needed for up to 10 doses for nausea or vomiting. 10 tablet 0   saxagliptin HCl (ONGLYZA) 5 MG TABS tablet Take 5 mg by mouth daily.     STUDY - ASPIRE - aspirin 81 mg or placebo tablet (PI-Sethi) Take 1 tablet by mouth at bedtime.     valsartan (DIOVAN) 160 MG tablet Take 80 mg by mouth daily. Taking 0.5 tablet every morning     VITAMIN D PO Take by mouth.     warfarin (COUMADIN) 5 MG tablet Take 5 mg by mouth daily. Take 1 tablet mondays, wednesdays, fridays, and sundays. Take 0.5 tablet tuesdays, thursdays, and saturdays     No current facility-administered medications on file prior to visit.    There are no Patient Instructions on file for this visit. No follow-ups on file.   Kris Hartmann, NP

## 2023-03-08 ENCOUNTER — Encounter (HOSPITAL_COMMUNITY): Payer: Self-pay

## 2023-03-10 ENCOUNTER — Encounter (HOSPITAL_COMMUNITY): Payer: Self-pay

## 2023-04-14 ENCOUNTER — Telehealth (INDEPENDENT_AMBULATORY_CARE_PROVIDER_SITE_OTHER): Payer: Self-pay

## 2023-04-14 NOTE — Telephone Encounter (Signed)
Spoke with the patient and she is scheduled with Dr. Wyn Quaker on 04/19/23 for a permcath exchange with a 12:00 pm arrival time to the [facility]. Pre-procedure instructions were discussed and will be sent to Mychart.

## 2023-04-19 ENCOUNTER — Other Ambulatory Visit: Payer: Self-pay

## 2023-04-19 ENCOUNTER — Encounter
Admission: RE | Disposition: A | Discharge status: Home / Self Care | Payer: Self-pay | Source: Home / Self Care | Attending: Vascular Surgery

## 2023-04-19 ENCOUNTER — Ambulatory Visit
Admission: RE | Admit: 2023-04-19 | Discharge: 2023-04-19 | Disposition: A | Discharge status: Home / Self Care | Payer: Medicare PPO | Attending: Vascular Surgery | Admitting: Vascular Surgery

## 2023-04-19 DIAGNOSIS — Y841 Kidney dialysis as the cause of abnormal reaction of the patient, or of later complication, without mention of misadventure at the time of the procedure: Secondary | ICD-10-CM | POA: Insufficient documentation

## 2023-04-19 DIAGNOSIS — T8241XA Breakdown (mechanical) of vascular dialysis catheter, initial encounter: Secondary | ICD-10-CM | POA: Diagnosis not present

## 2023-04-19 DIAGNOSIS — I120 Hypertensive chronic kidney disease with stage 5 chronic kidney disease or end stage renal disease: Secondary | ICD-10-CM | POA: Diagnosis not present

## 2023-04-19 DIAGNOSIS — N186 End stage renal disease: Secondary | ICD-10-CM

## 2023-04-19 DIAGNOSIS — Z992 Dependence on renal dialysis: Secondary | ICD-10-CM | POA: Diagnosis not present

## 2023-04-19 DIAGNOSIS — T8249XA Other complication of vascular dialysis catheter, initial encounter: Secondary | ICD-10-CM | POA: Insufficient documentation

## 2023-04-19 HISTORY — PX: DIALYSIS/PERMA CATHETER INSERTION: CATH118288

## 2023-04-19 LAB — POTASSIUM (ARMC VASCULAR LAB ONLY): Potassium (ARMC vascular lab): 3.2 mmol/L — ABNORMAL LOW (ref 3.5–5.1)

## 2023-04-19 LAB — GLUCOSE, CAPILLARY
Glucose-Capillary: 105 mg/dL — ABNORMAL HIGH (ref 70–99)
Glucose-Capillary: 102 mg/dL — ABNORMAL HIGH (ref 70–99)

## 2023-04-19 SURGERY — DIALYSIS/PERMA CATHETER INSERTION
Anesthesia: Moderate Sedation

## 2023-04-19 MED ORDER — CEFAZOLIN SODIUM-DEXTROSE 1-4 GM/50ML-% IV SOLN
1.0000 g | INTRAVENOUS | Status: AC
  Administered 2023-04-19: 1 g via INTRAVENOUS

## 2023-04-19 MED ORDER — METHYLPREDNISOLONE SODIUM SUCC 125 MG IJ SOLR: 125.0000 mg | Freq: Once | INTRAMUSCULAR | Status: DC | PRN

## 2023-04-19 MED ORDER — SODIUM CHLORIDE 0.9 % IV SOLN: INTRAVENOUS | Status: DC

## 2023-04-19 MED ORDER — DIPHENHYDRAMINE HCL 50 MG/ML IJ SOLN: 50.0000 mg | Freq: Once | INTRAMUSCULAR | Status: DC | PRN

## 2023-04-19 MED ORDER — CEFAZOLIN SODIUM-DEXTROSE 1-4 GM/50ML-% IV SOLN
INTRAVENOUS | Status: AC
  Filled 2023-04-19: qty 50

## 2023-04-19 MED ORDER — LIDOCAINE-EPINEPHRINE (PF) 1 %-1:200000 IJ SOLN
INTRAMUSCULAR | Status: DC | PRN
  Administered 2023-04-19: 20 mL via INTRADERMAL

## 2023-04-19 MED ORDER — MIDAZOLAM HCL 2 MG/2ML IJ SOLN
INTRAMUSCULAR | Status: DC | PRN
  Administered 2023-04-19 (×2): 1 mg via INTRAVENOUS

## 2023-04-19 MED ORDER — FENTANYL CITRATE PF 50 MCG/ML IJ SOSY
PREFILLED_SYRINGE | INTRAMUSCULAR | Status: AC
  Filled 2023-04-19: qty 1

## 2023-04-19 MED ORDER — MIDAZOLAM HCL 2 MG/ML PO SYRP: 8.0000 mg | ORAL_SOLUTION | Freq: Once | ORAL | Status: DC | PRN

## 2023-04-19 MED ORDER — FAMOTIDINE 20 MG PO TABS: 40.0000 mg | ORAL_TABLET | Freq: Once | ORAL | Status: DC | PRN

## 2023-04-19 MED ORDER — FENTANYL CITRATE (PF) 100 MCG/2ML IJ SOLN
INTRAMUSCULAR | Status: DC | PRN
  Administered 2023-04-19: 50 ug via INTRAVENOUS

## 2023-04-19 MED ORDER — MIDAZOLAM HCL 2 MG/2ML IJ SOLN
INTRAMUSCULAR | Status: AC
  Filled 2023-04-19: qty 2

## 2023-04-19 SURGICAL SUPPLY — 5 items
CATH PALIN MAXID VT KIT 19CM (CATHETERS) IMPLANT
GUIDEWIRE SUPER STIFF .035X180 (WIRE) IMPLANT
PACK ANGIOGRAPHY (CUSTOM PROCEDURE TRAY) IMPLANT
SUT MNCRL AB 4-0 PS2 18 (SUTURE) IMPLANT
SUT PROLENE 0 CT 1 30 (SUTURE) IMPLANT

## 2023-04-19 NOTE — H&P (Signed)
[facility]ASC[facility] & VEIN SPECIALISTS Admission History & Physical  MRN : [id number]  Rachel English is a 67 y.o. (Jul 08, 1956) female who presents with chief complaint of No chief complaint on file. Marland Kitchen  History of Present Illness:  I am asked to evaluate the patient by the dialysis center. The patient was sent here because they were unable to achieve adequate dialysis yesterday. Furthermore the Center states they were unable to aspirate either lumen of the catheter yesterday.  This problem has been getting worse for about 1 week. The patient is unaware of any other change.   Patient denies pain or tenderness overlying the access.  There is no pain with dialysis.  Patient denies fevers or shaking chills while on dialysis.    There have multiple any past interventions and declots of his multiple different access.  The patient is not chronically hypotensive on dialysis.   No current facility-administered medications for this encounter.     Past Surgical History:  Procedure Laterality Date   CARDIAC SURGERY     CHOLECYSTECTOMY     OOPHORECTOMY Left    REPLACEMENT TOTAL KNEE Left      Social History   Tobacco Use   Smoking status: Never   Smokeless tobacco: Never  Vaping Use   Vaping status: Never Used  Substance Use Topics   Alcohol use: Not Currently   Drug use: Never     Family History  Problem Relation Age of Onset   Breast cancer Maternal Aunt     No family history of bleeding or clotting disorders, autoimmune disease or porphyria  No Known Allergies   REVIEW OF SYSTEMS (Negative unless checked)  Constitutional: [] Weight loss  [] Fever  [] Chills Cardiac: [] Chest pain   [] Chest pressure   [] Palpitations   [] Shortness of breath when laying flat   [] Shortness of breath at rest   [x] Shortness of breath with exertion. Vascular:  [] Pain in legs with walking   [] Pain in legs at rest   [] Pain in legs when laying flat   [] Claudication   [] Pain in feet when walking  [] Pain in  feet at rest  [] Pain in feet when laying flat   [] History of DVT   [] Phlebitis   [] Swelling in legs   [] Varicose veins   [] Non-healing ulcers Pulmonary:   [] Uses home oxygen   [] Productive cough   [] Hemoptysis   [] Wheeze  [] COPD   [] Asthma Neurologic:  [] Dizziness  [] Blackouts   [] Seizures   [] History of stroke   [] History of TIA  [] Aphasia   [] Temporary blindness   [] Dysphagia   [] Weakness or numbness in arms   [] Weakness or numbness in legs Musculoskeletal:  [] Arthritis   [] Joint swelling   [] Joint pain   [] Low back pain Hematologic:  [] Easy bruising  [] Easy bleeding   [] Hypercoagulable state   [] Anemic  [] Hepatitis Gastrointestinal:  [] Blood in stool   [] Vomiting blood  [] Gastroesophageal reflux/heartburn   [] Difficulty swallowing. Genitourinary:  [x] Chronic kidney disease   [] Difficult urination  [] Frequent urination  [] Burning with urination   [] Blood in urine Skin:  [] Rashes   [] Ulcers   [] Wounds Psychological:  [] History of anxiety   []  History of major depression.  Physical Examination  There were no vitals filed for this visit. There is no height or weight on file to calculate BMI. Gen: WD/WN, NAD Head: [place]/AT, No temporalis wasting.  Ear/Nose/Throat: Hearing grossly intact, nares w/o erythema or drainage, oropharynx w/o Erythema/Exudate,  Eyes: Conjunctiva clear, sclera non-icteric Neck: Trachea midline.  No JVD.  Pulmonary:  Good air movement, respirations not labored, no use of accessory muscles.  Cardiac: RRR, normal S1, S2. Vascular:  tunneled catheter without tenderness or drainage Vessel Right Left  Radial Palpable Palpable  Gastrointestinal: soft, non-tender/non-distended. No guarding/reflex.  Musculoskeletal: M/S 5/5 throughout.  Extremities without ischemic changes.  No deformity or atrophy.  Neurologic: Sensation grossly intact in extremities.  Symmetrical.  Speech is fluent. Motor exam as listed above. Psychiatric: Judgment intact, Mood & affect appropriate for pt's  clinical situation. Dermatologic: No rashes or ulcers noted.  No cellulitis or open wounds.    CBC Lab Results  Component Value Date   WBC 9.7 11/05/2021   HGB 11.3 (L) 11/05/2021   HCT 36.2 11/05/2021   MCV 92.8 11/05/2021   PLT 211 11/05/2021    BMET    Component Value Date/Time   NA 140 06/23/2021 1037   K 4.5 06/23/2021 1037   CL 103 06/23/2021 1037   CO2 28 06/23/2021 1037   GLUCOSE 181 (H) 06/23/2021 1037   BUN 63 (H) 06/23/2021 1037   CREATININE 2.11 (H) 06/23/2021 1037   CALCIUM 9.5 06/23/2021 1037   GFRNONAA 25 (L) 06/23/2021 1037   CrCl cannot be calculated (Patient's most recent lab result is older than the maximum 21 days allowed.).  COAG Lab Results  Component Value Date   INR 4.4 (HH) 11/05/2021   INR 2.0 (H) 06/23/2021    Radiology No results found.  Assessment/Plan 1.  Complication dialysis device with thrombosis AV access:  Patient's tunneled catheter is thrombosed. The patient will undergo exchange of the catheter same venous access using interventional techniques.  The risks and benefits were described to the patient.  All questions were answered.  The patient agrees to proceed with intervention.  2.  End-stage renal disease requiring hemodialysis:  Patient will continue dialysis therapy without further interruption after a successful exchange 3.  Hypertension:  Patient will continue medical management; nephrology is following no changes in oral medications.     Festus Barren, MD  04/19/2023 12:11 PM

## 2023-04-19 NOTE — Op Note (Signed)
OPERATIVE NOTE    PRE-OPERATIVE DIAGNOSIS: 1. ESRD 2. Non-functional permcath  POST-OPERATIVE DIAGNOSIS: same as above  PROCEDURE: Fluoroscopic guidance for placement of catheter Placement of a 19 cm tip to cuff tunneled hemodialysis catheter via the right internal jugular vein and removal of previous catheter  SURGEON: Festus Barren, MD  ANESTHESIA:  Local with moderate conscious sedation for 14 minutes using 2 mg of Versed and 50 mcg of Fentanyl  ESTIMATED BLOOD LOSS: 5 cc  FINDING(S): none  SPECIMEN(S):  None  INDICATIONS:   Patient is a 67 y.o.female who presents with non-functional dialysis catheter and ESRD.  The patient needs long term dialysis access for their ESRD, and a Permcath is necessary.  Risks and benefits are discussed and informed consent is obtained.    DESCRIPTION: After obtaining full informed written consent, the patient was brought back to the vascular suite. The patient received moderate conscious sedation during a face-to-face encounter with me present throughout the entire procedure and supervising the RN monitoring the vital signs, pulse oximetry, telemetry, and mental status throughout the entire procedure. The patient's existing catheter, right neck and chest were sterilely prepped and draped in a sterile surgical field was created.  The existing catheter was dissected free from the fibrous sheath securing the cuff with hemostats and blunt dissection.  A wire was placed. The existing catheter was then removed and the wire used to keep venous access. I selected a 19 cm tip to cuff tunneled dialysis catheter.  Using fluoroscopic guidance the catheter tips were parked in the right atrium. The appropriate distal connectors were placed. It withdrew blood well and flushed easily with heparinized saline and a concentrated heparin solution was then placed. It was secured to the chest wall with 2 Prolene sutures. A 4-0 Monocryl pursestring suture was placed around the exit  site. Sterile dressings were placed. The patient tolerated the procedure well and was taken to the recovery room in stable condition.  COMPLICATIONS: None  CONDITION: Stable  Festus Barren 04/19/2023 2:36 PM   This note was created with Dragon Medical transcription system. Any errors in dictation are purely unintentional.

## 2023-04-20 ENCOUNTER — Encounter: Payer: Self-pay | Admitting: Vascular Surgery

## 2023-04-20 MED ORDER — HEPARIN SODIUM (PORCINE) 1000 UNIT/ML IJ SOLN
INTRAMUSCULAR | Status: AC | PRN
  Administered 2023-04-19: 10000 [IU] via INTRAVENOUS

## 2023-04-20 MED ORDER — HEPARIN (PORCINE) IN NACL 1000-0.9 UT/500ML-% IV SOLN
INTRAVENOUS | Status: AC | PRN
  Administered 2023-04-19: 500 mL

## 2023-05-13 ENCOUNTER — Telehealth (INDEPENDENT_AMBULATORY_CARE_PROVIDER_SITE_OTHER): Payer: Self-pay

## 2023-05-13 NOTE — Telephone Encounter (Signed)
Spoke with the patient and she is scheduled with Dr. Wyn Quaker on 05/20/23 with a 2:00 pm arrival time to the [facility] for a permcath removal. Pre-procedure instructions were discussed and will be sent to My chart and mailed.

## 2023-05-20 ENCOUNTER — Encounter
Admission: RE | Disposition: A | Discharge status: Home / Self Care | Payer: Self-pay | Source: Home / Self Care | Attending: Vascular Surgery

## 2023-05-20 ENCOUNTER — Ambulatory Visit
Admission: RE | Admit: 2023-05-20 | Discharge: 2023-05-20 | Disposition: A | Discharge status: Home / Self Care | Payer: Medicare PPO | Attending: Vascular Surgery | Admitting: Vascular Surgery

## 2023-05-20 DIAGNOSIS — N186 End stage renal disease: Secondary | ICD-10-CM | POA: Diagnosis not present

## 2023-05-20 DIAGNOSIS — I120 Hypertensive chronic kidney disease with stage 5 chronic kidney disease or end stage renal disease: Secondary | ICD-10-CM | POA: Diagnosis not present

## 2023-05-20 DIAGNOSIS — E1122 Type 2 diabetes mellitus with diabetic chronic kidney disease: Secondary | ICD-10-CM | POA: Insufficient documentation

## 2023-05-20 DIAGNOSIS — Z452 Encounter for adjustment and management of vascular access device: Secondary | ICD-10-CM

## 2023-05-20 DIAGNOSIS — Z4901 Encounter for fitting and adjustment of extracorporeal dialysis catheter: Secondary | ICD-10-CM | POA: Diagnosis present

## 2023-05-20 DIAGNOSIS — Z992 Dependence on renal dialysis: Secondary | ICD-10-CM | POA: Diagnosis not present

## 2023-05-20 DIAGNOSIS — Z95828 Presence of other vascular implants and grafts: Secondary | ICD-10-CM

## 2023-05-20 HISTORY — PX: DIALYSIS/PERMA CATHETER REMOVAL: CATH118289

## 2023-05-20 SURGERY — DIALYSIS/PERMA CATHETER REMOVAL
Anesthesia: LOCAL

## 2023-05-20 MED ORDER — LIDOCAINE-EPINEPHRINE (PF) 1 %-1:200000 IJ SOLN
INTRAMUSCULAR | Status: DC | PRN
  Administered 2023-05-20: 15 mL via INTRADERMAL

## 2023-05-20 SURGICAL SUPPLY — 3 items
FORCEPS HALSTEAD CVD 5IN STRL (INSTRUMENTS) IMPLANT
SCALPEL PROTECTED #11 DISP (BLADE) IMPLANT
TRAY LACERAT/PLASTIC (MISCELLANEOUS) IMPLANT

## 2023-05-20 NOTE — H&P (Addendum)
[facility] VASCULAR [facility] VEIN SPECIALISTS Admission History & Physical  MRN : [id number]  Rachel English is a 67 y.o. ([birth year]) female who presents with chief complaint of No chief complaint on file. Marland Kitchen  History of Present Illness: I am asked to evaluate the patient by the dialysis center. The patient was sent here because they have a nonfunctioning tunneled catheter and is now doing PD and not using her permcath   Patient denies pain or tenderness overlying the access.  There is no pain with dialysis.  The patient denies hand pain or finger pain consistent with steal syndrome.  No fevers or chills while on dialysis.    No current facility-administered medications for this encounter.   Facility-Administered Medications Ordered in Other Encounters  Medication Dose Route Frequency Provider Last Rate Last Admin   Heparin (Porcine) in NaCl 1000-0.9 UT/500ML-% SOLN    PRN Annice Needy, MD   500 mL at 04/19/23 1425   heparin sodium (porcine) injection    PRN Annice Needy, MD   10,000 Units at 04/19/23 1425    Past Medical History:  Diagnosis Date   Diabetes mellitus without complication (HCC)    Hypertension    Renal disorder    Thyroid disease     Past Surgical History:  Procedure Laterality Date   CARDIAC SURGERY     CHOLECYSTECTOMY     DIALYSIS/PERMA CATHETER INSERTION N/A 04/19/2023   Procedure: DIALYSIS/PERMA CATHETER INSERTION;  Surgeon: Annice Needy, MD;  Location: [facility] INVASIVE CV LAB;  Service: Cardiovascular;  Laterality: N/A;   OOPHORECTOMY Left    REPLACEMENT TOTAL KNEE Left     Social History   Tobacco Use   Smoking status: Never   Smokeless tobacco: Never  Vaping Use   Vaping status: Never Used  Substance Use Topics   Alcohol use: Not Currently   Drug use: Never     Family History  Problem Relation Age of Onset   Breast cancer Maternal Aunt     No family history of bleeding or clotting disorders, autoimmune disease or porphyria  No Known  Allergies   REVIEW OF SYSTEMS (Negative unless checked)  Constitutional: [] Weight loss  [] Fever  [] Chills Cardiac: [] Chest pain   [] Chest pressure   [] Palpitations   [] Shortness of breath when laying flat   [] Shortness of breath at rest   [x] Shortness of breath with exertion. Vascular:  [] Pain in legs with walking   [] Pain in legs at rest   [] Pain in legs when laying flat   [] Claudication   [] Pain in feet when walking  [] Pain in feet at rest  [] Pain in feet when laying flat   [] History of DVT   [] Phlebitis   [] Swelling in legs   [] Varicose veins   [] Non-healing ulcers Pulmonary:   [] Uses home oxygen   [] Productive cough   [] Hemoptysis   [] Wheeze  [] COPD   [] Asthma Neurologic:  [] Dizziness  [] Blackouts   [] Seizures   [] History of stroke   [] History of TIA  [] Aphasia   [] Temporary blindness   [] Dysphagia   [] Weakness or numbness in arms   [] Weakness or numbness in legs Musculoskeletal:  [x] Arthritis   [] Joint swelling   [x] Joint pain   [] Low back pain Hematologic:  [] Easy bruising  [] Easy bleeding   [] Hypercoagulable state   [x] Anemic  [] Hepatitis Gastrointestinal:  [] Blood in stool   [] Vomiting blood  [] Gastroesophageal reflux/heartburn   [] Difficulty swallowing. Genitourinary:  [x] Chronic kidney disease   [] Difficult urination  [] Frequent urination  [] Burning with urination   []   Blood in urine Skin:  [] Rashes   [] Ulcers   [] Wounds Psychological:  [] History of anxiety   []  History of major depression.  Physical Examination  Vitals:   05/20/23 1410  BP: 121/65  Pulse: 72  Resp: 16  SpO2: 95%   There is no height or weight on file to calculate BMI. Gen: WD/WN, NAD Head: [place]/AT, No temporalis wasting. Ear/Nose/Throat: Hearing grossly intact, nares w/o erythema or drainage, oropharynx w/o Erythema/Exudate,  Eyes: Conjunctiva clear, sclera non-icteric Neck: Trachea midline.  No JVD.  Pulmonary:  Good air movement, respirations not labored, no use of accessory muscles.  Cardiac: RRR, normal  S1, S2. Vascular: permcath in place in right chest Vessel Right Left  Radial Palpable Palpable   Musculoskeletal: M/S 5/5 throughout.  Extremities without ischemic changes.  No deformity or atrophy.  Neurologic: Sensation grossly intact in extremities.  Symmetrical.  Speech is fluent. Motor exam as listed above. Psychiatric: Judgment intact, Mood & affect appropriate for pt's clinical situation. Dermatologic: No rashes or ulcers noted.  No cellulitis or open wounds. Lymph : No Cervical, Axillary, or Inguinal lymphadenopathy.   CBC Lab Results  Component Value Date   WBC 9.7 11/05/2021   HGB 11.3 (L) 11/05/2021   HCT 36.2 11/05/2021   MCV 92.8 11/05/2021   PLT 211 11/05/2021    BMET    Component Value Date/Time   NA 140 06/23/2021 1037   K 4.5 06/23/2021 1037   CL 103 06/23/2021 1037   CO2 28 06/23/2021 1037   GLUCOSE 181 (H) 06/23/2021 1037   BUN 63 (H) 06/23/2021 1037   CREATININE 2.11 (H) 06/23/2021 1037   CALCIUM 9.5 06/23/2021 1037   GFRNONAA 25 (L) 06/23/2021 1037   CrCl cannot be calculated (Patient's most recent lab result is older than the maximum 21 days allowed.).  COAG Lab Results  Component Value Date   INR 4.4 (HH) 11/05/2021   INR 2.0 (H) 06/23/2021    Radiology No results found.  Assessment/Plan 1.  Complication dialysis device with non-functional AV access:  Patient's Tunneled catheter is not being used. The patient has an extremity access that is functioning well. Therefore, the patient will undergo removal of the tunneled catheter under local anesthesia.  The risks and benefits were described to the patient.  All questions were answered.  The patient agrees to proceed with angiography and intervention. Potassium will be drawn to ensure that it is an appropriate level prior to performing intervention. 2.  End-stage renal disease requiring hemodialysis:  Patient will continue dialysis therapy without further interruption if a successful intervention is  not achieved then a tunneled catheter will be placed. Dialysis has already been arranged. 3.  Hypertension:  Patient will continue medical management; nephrology is following no changes in oral medications. 4. Diabetes mellitus:  Glucose will be monitored and oral medications been held this morning once the patient has undergone the patient's procedure po intake will be reinitiated and again Accu-Cheks will be used to assess the blood glucose level and treat as needed. The patient will be restarted on the patient's usual hypoglycemic regime     Festus Barren, MD  05/20/2023 2:37 PM

## 2023-05-20 NOTE — Op Note (Signed)
Operative Note     Preoperative diagnosis:   1. ESRD with functional permanent access  Postoperative diagnosis:  1. ESRD with functional permanent access  Procedure:  Removal of right jugular Permcath  Surgeon:  Festus Barren, MD  Anesthesia:  Local  EBL:  Minimal  Indication for the Procedure:  The patient has a functional permanent dialysis access and no longer needs their permcath.  This can be removed.  Risks and benefits are discussed and informed consent is obtained.  Description of the Procedure:  The patient's right neck, chest and existing catheter were sterilely prepped and draped. The area around the catheter was anesthetized copiously with 1% lidocaine. The catheter was dissected out with curved hemostats until the cuff was freed from the surrounding fibrous sheath. The fiber sheath was transected, and the catheter was then removed in its entirety using gentle traction. Pressure was held and sterile dressings were placed. The patient tolerated the procedure well and was taken to the recovery room in stable condition.     Festus Barren  05/20/2023, 3:29 PM This note was created with Dragon Medical transcription system. Any errors in dictation are purely unintentional.

## 2023-05-21 ENCOUNTER — Encounter: Payer: Self-pay | Admitting: Vascular Surgery

## 2023-05-21 MED ORDER — LIDOCAINE-EPINEPHRINE (PF) 1 %-1:200000 IJ SOLN
INTRAMUSCULAR | Status: DC | PRN
  Administered 2023-05-20: 20 mL

## 2023-07-01 ENCOUNTER — Other Ambulatory Visit: Payer: Self-pay | Admitting: Family Medicine

## 2023-07-01 DIAGNOSIS — Z1231 Encounter for screening mammogram for malignant neoplasm of breast: Secondary | ICD-10-CM

## 2023-07-16 ENCOUNTER — Ambulatory Visit
Admission: RE | Admit: 2023-07-16 | Discharge: 2023-07-16 | Disposition: A | Discharge status: Home / Self Care | Payer: Medicare PPO | Source: Ambulatory Visit | Attending: Family Medicine | Admitting: Family Medicine

## 2023-07-16 DIAGNOSIS — Z1231 Encounter for screening mammogram for malignant neoplasm of breast: Secondary | ICD-10-CM | POA: Insufficient documentation

## 2023-07-22 ENCOUNTER — Other Ambulatory Visit: Payer: Self-pay | Admitting: Family Medicine

## 2023-07-22 DIAGNOSIS — R928 Other abnormal and inconclusive findings on diagnostic imaging of breast: Secondary | ICD-10-CM

## 2023-07-28 ENCOUNTER — Ambulatory Visit
Admission: RE | Admit: 2023-07-28 | Discharge: 2023-07-28 | Disposition: A | Discharge status: Home / Self Care | Payer: Medicare PPO | Source: Ambulatory Visit | Attending: Family Medicine | Admitting: Family Medicine

## 2023-07-28 DIAGNOSIS — R928 Other abnormal and inconclusive findings on diagnostic imaging of breast: Secondary | ICD-10-CM | POA: Diagnosis present

## 2023-09-24 IMAGING — CR DG CHEST 2V
2 series · 2 of 2 positions shown · non-contrast
Comparison: None.

CLINICAL DATA: Chest pain.

EXAM:
CHEST - 2 VIEW

[chest lat]
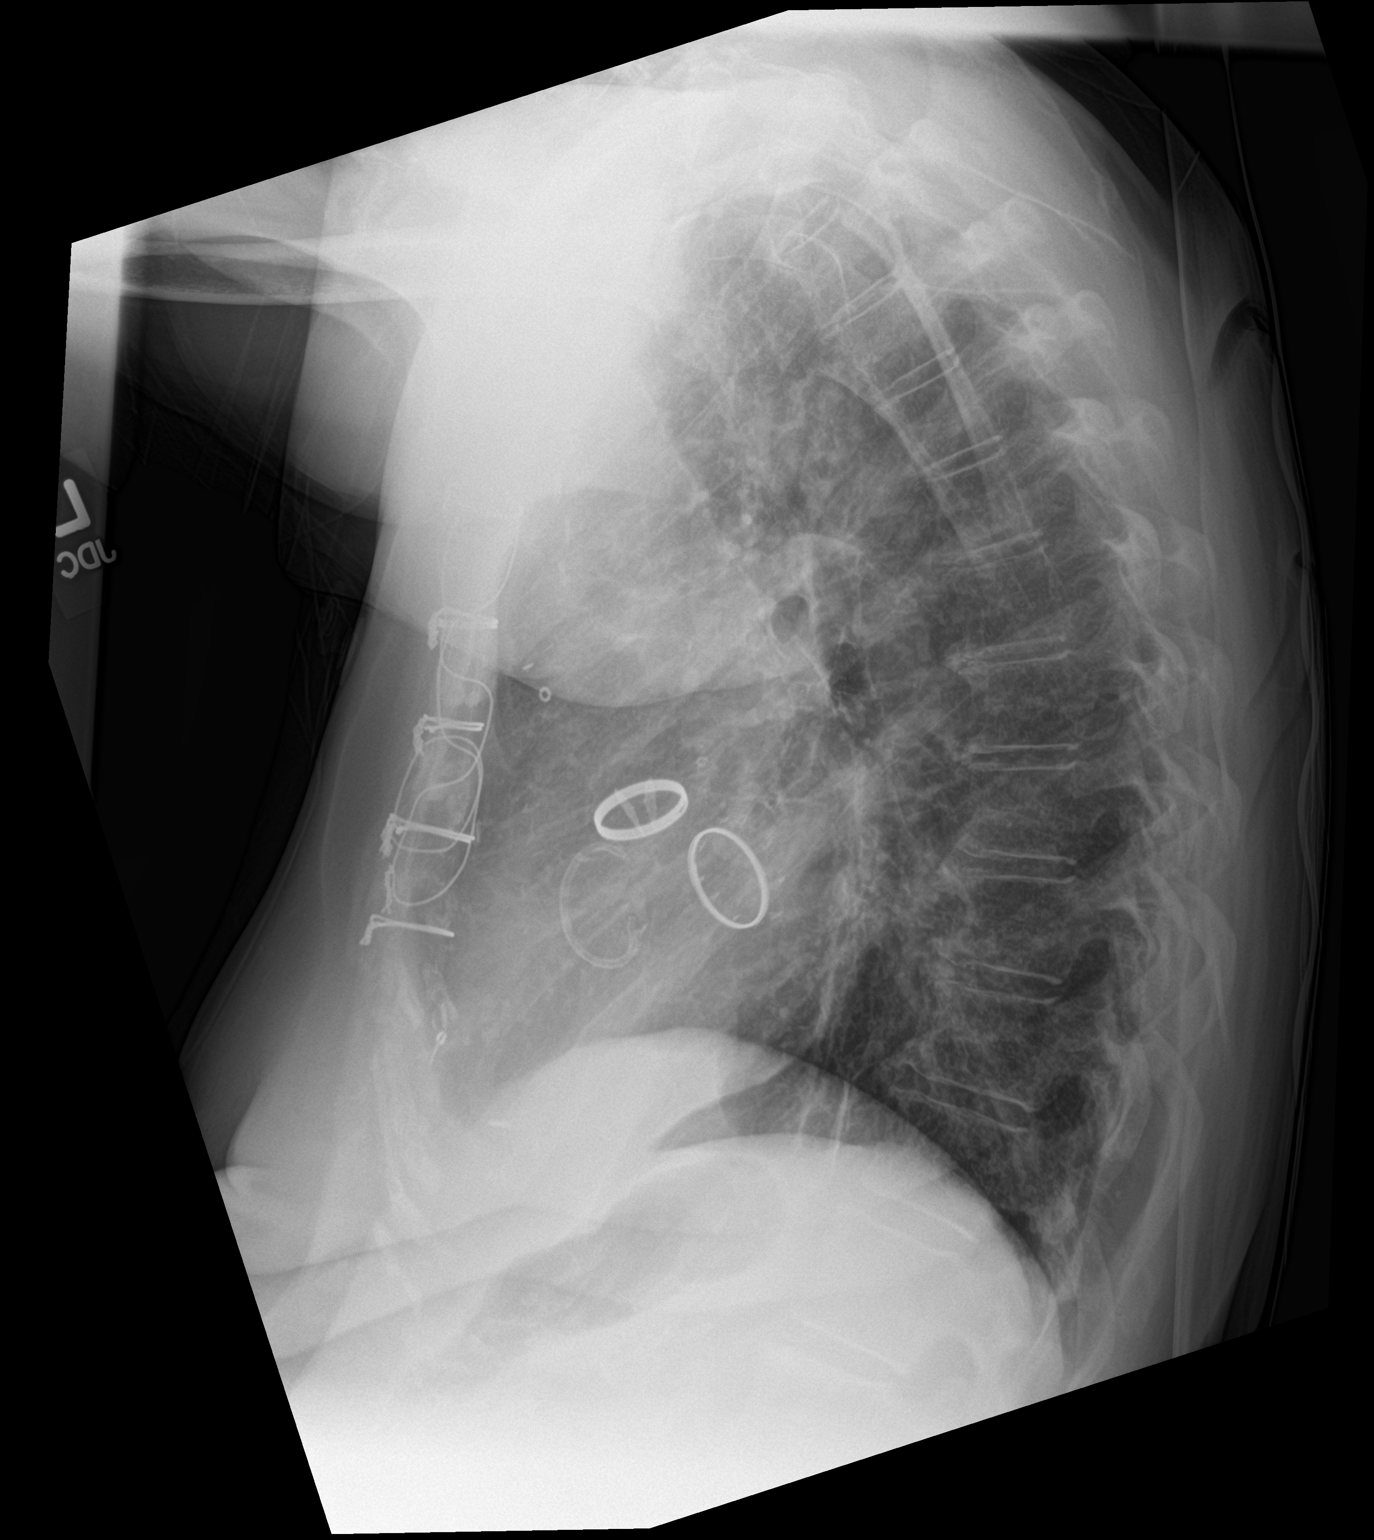

[chest ap]
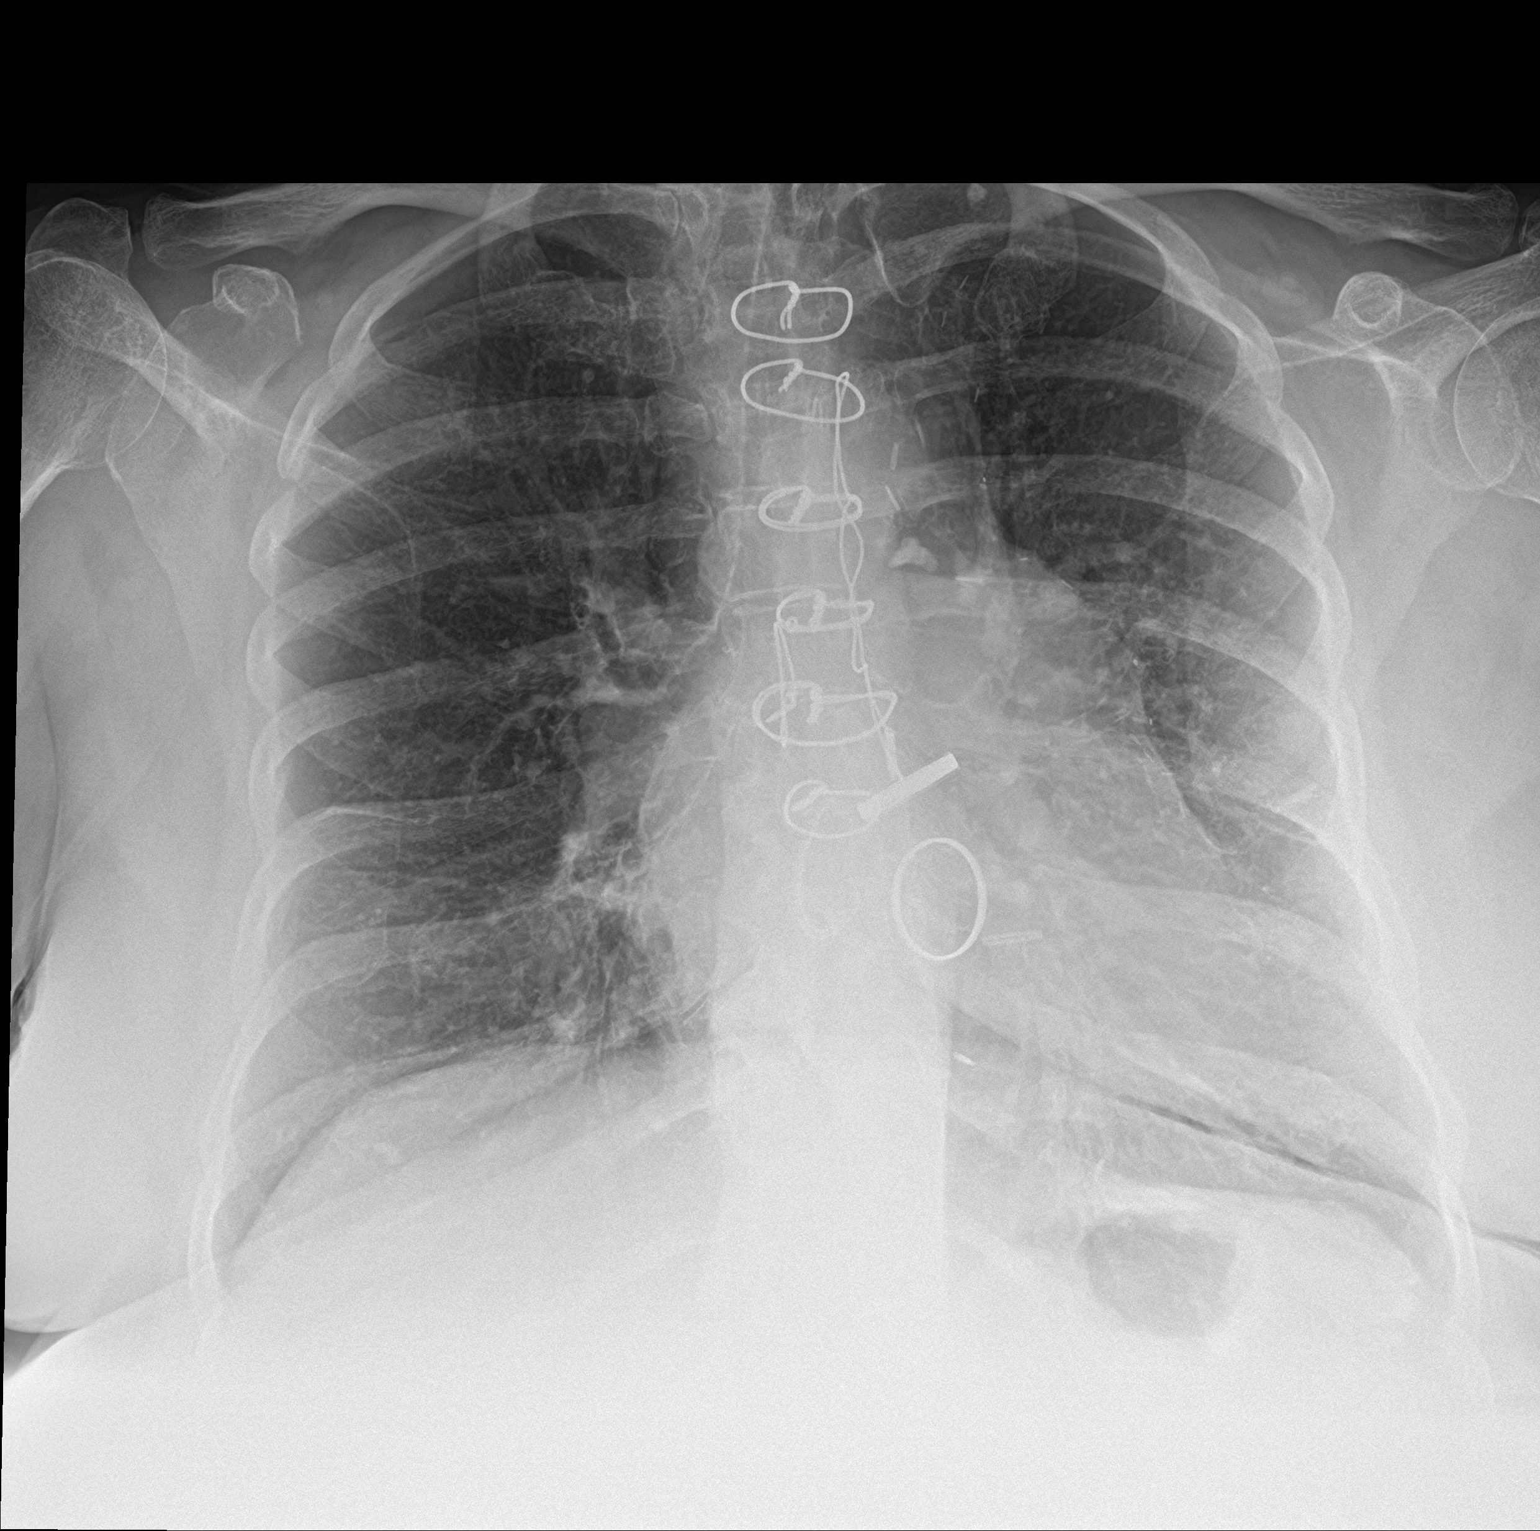

[2 of 2 positions shown; findings below may reference images not displayed]

FINDINGS: The heart size and mediastinal contours are within normal limits.
Both lungs are clear. Status post aortic, mitral and tricuspid valve
repair. The visualized skeletal structures are unremarkable.
IMPRESSION: No active cardiopulmonary disease.

## 2023-09-24 IMAGING — CT CT HEAD W/O CM
3 series · 16 of 47 positions shown, 19 images · non-contrast
Comparison: None.

CLINICAL DATA: Lightheadedness and nausea

EXAM:
CT HEAD WITHOUT CONTRAST
TECHNIQUE: Contiguous axial images were obtained from the base of the skull
through the vertex without intravenous contrast.

[Series 2: head wo · axial · 0.44mm/px · z∈[-98,+27]mm · 10 of 31 slices shown, 13 images]
[im 3/31  brain]
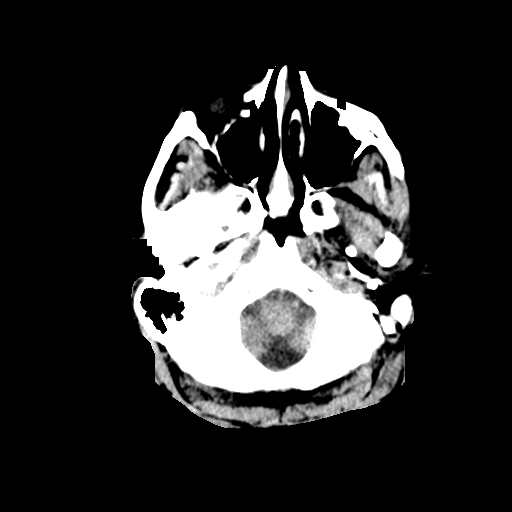
[im 3/31  bone]
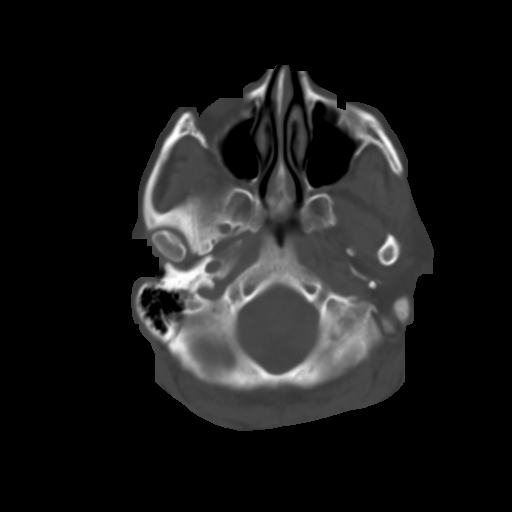
[im 6/31  brain]
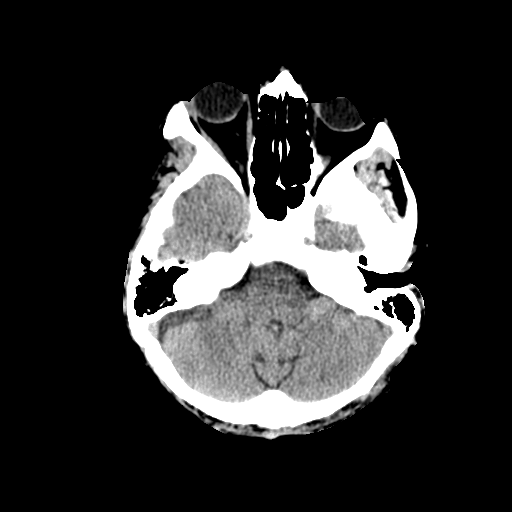
[im 9/31  brain]
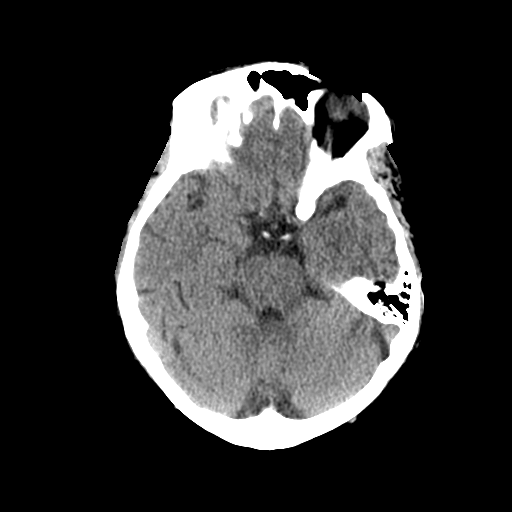
[im 11/31  brain]
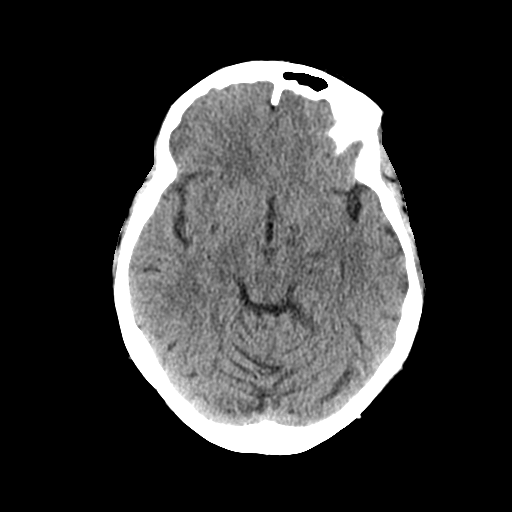
[im 14/31  brain]
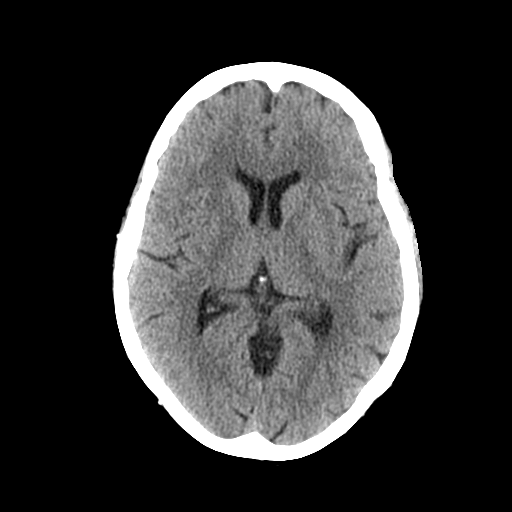
[im 14/31  bone]
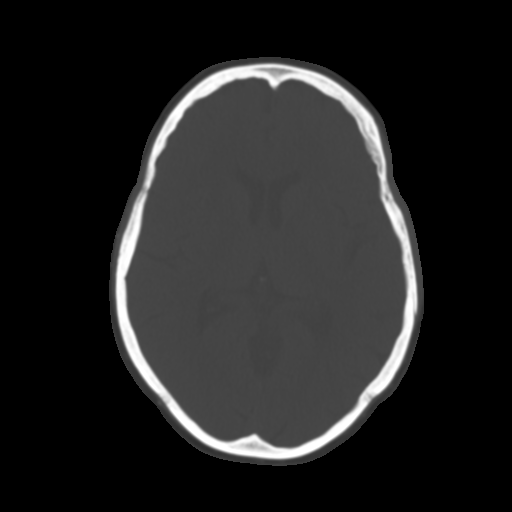
[im 17/31  brain]
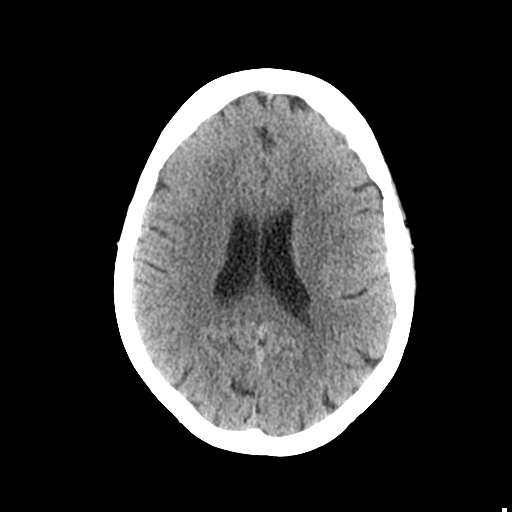
[im 20/31  brain]
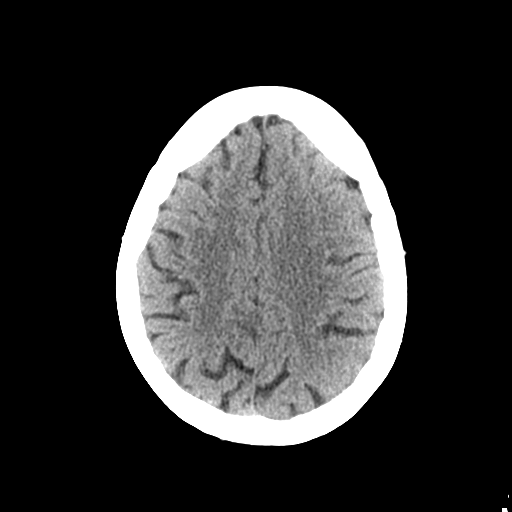
[im 23/31  brain]
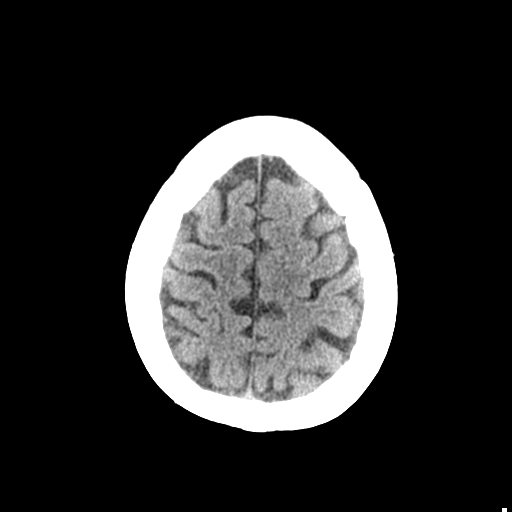
[im 25/31  brain]
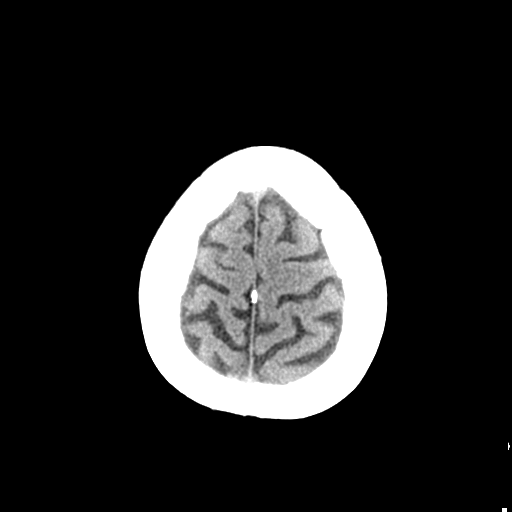
[im 25/31  bone]
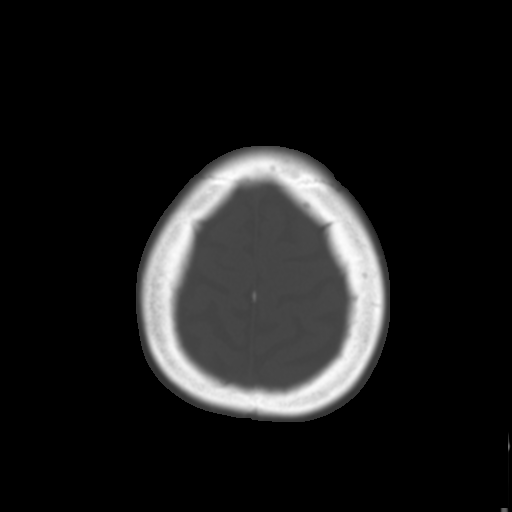
[im 28/31  brain]
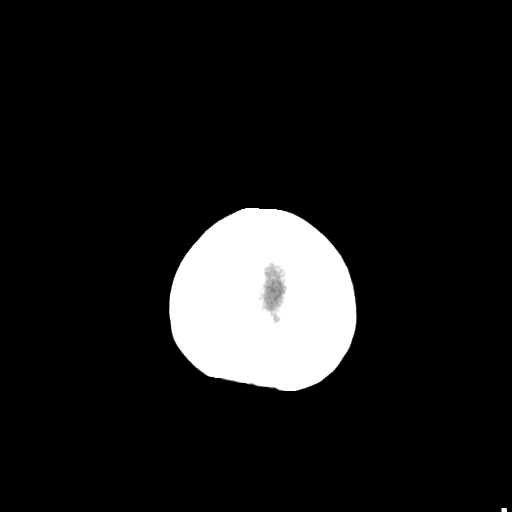

[Series 4: coronal soft tissue · coronal · 0.31mm/px · 3 of 59 slices shown]
[im 20/59  brain]
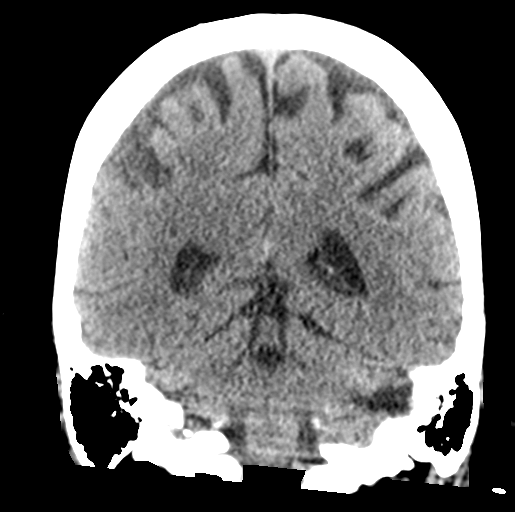
[im 26/59  brain]
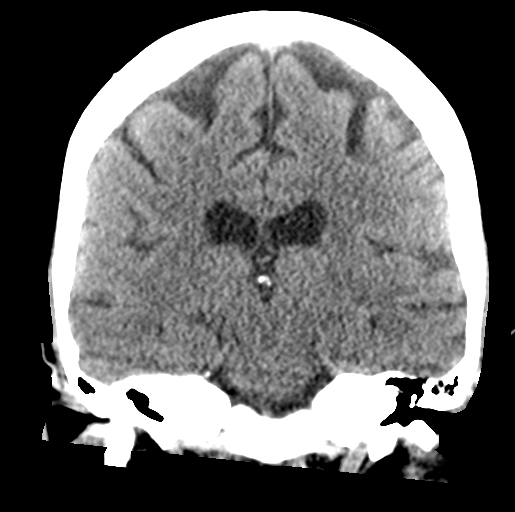
[im 33/59  brain]
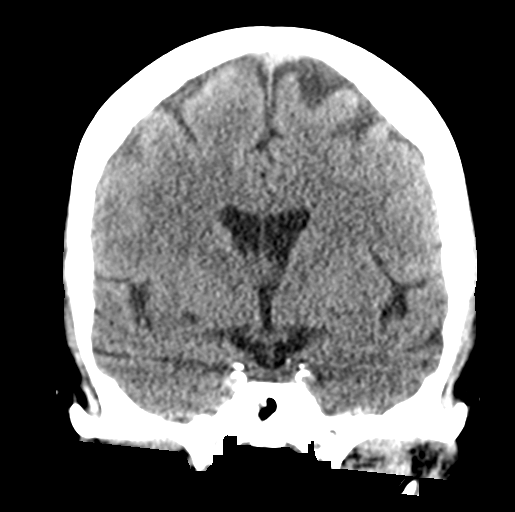

[Series 5: sagittal soft tissue · sagittal · 0.32mm/px · 3 of 47 slices shown]
[im 16/47  brain]
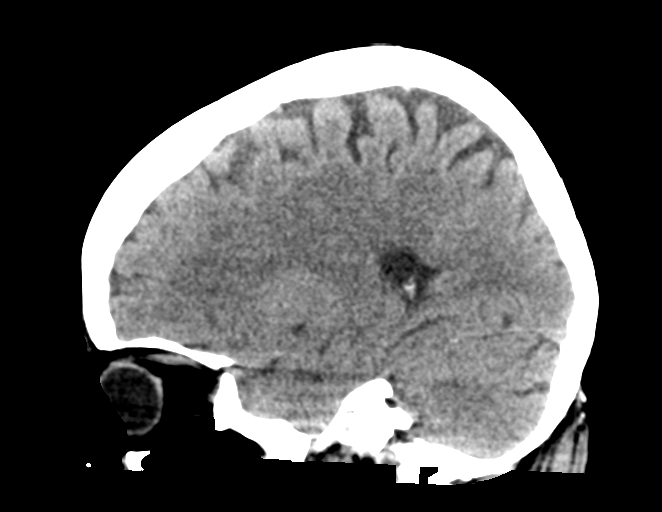
[im 24/47  brain]
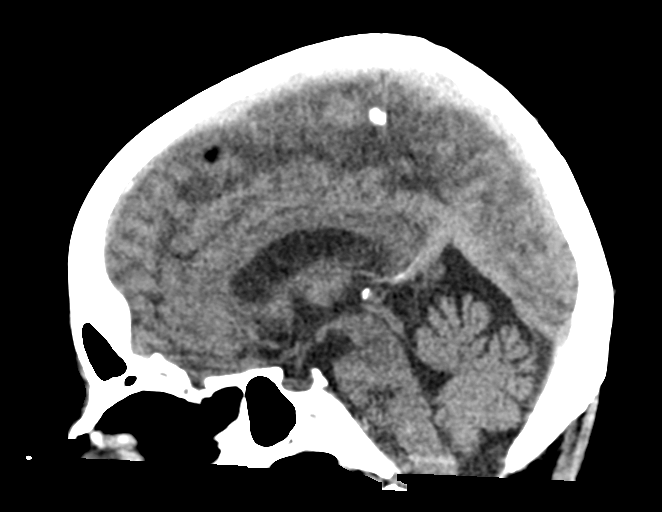
[im 31/47  brain]
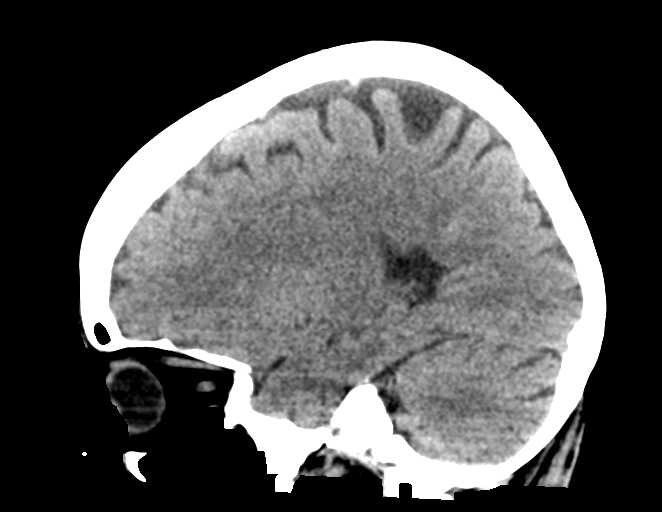

[16 of 47 positions shown; findings below may reference images not displayed]

FINDINGS: Brain: No significant volume loss. Preservation of gray white matter
junction. No evidence of acute large vascular territory infarction,
hemorrhage, hydrocephalus, extra-axial collection or mass
lesion/mass effect.

Vascular: No hyperdense vessel. Atherosclerotic calcifications of
the internal carotid arteries at skull base

Skull: Normal. Negative for fracture or focal lesion.

Sinuses/Orbits: Visualized portions of the paranasal sinuses and
mastoid air cells are predominantly clear. Orbits are unremarkable.

Other: None
IMPRESSION: No acute intracranial findings.

## 2023-10-25 ENCOUNTER — Encounter (INDEPENDENT_AMBULATORY_CARE_PROVIDER_SITE_OTHER): Payer: Medicare PPO

## 2023-10-25 ENCOUNTER — Ambulatory Visit (INDEPENDENT_AMBULATORY_CARE_PROVIDER_SITE_OTHER): Payer: Medicare PPO | Admitting: Vascular Surgery

## 2023-11-03 DIAGNOSIS — E785 Hyperlipidemia, unspecified: Secondary | ICD-10-CM | POA: Insufficient documentation

## 2023-11-03 DIAGNOSIS — N186 End stage renal disease: Secondary | ICD-10-CM | POA: Insufficient documentation

## 2023-11-03 DIAGNOSIS — E039 Hypothyroidism, unspecified: Secondary | ICD-10-CM | POA: Insufficient documentation

## 2023-11-03 DIAGNOSIS — I739 Peripheral vascular disease, unspecified: Secondary | ICD-10-CM | POA: Insufficient documentation

## 2023-11-03 NOTE — Progress Notes (Signed)
 MRN : [id number]  Rachel English is a 68 y.o. (1955-12-30) female who presents with chief complaint of check circulation.  History of Present Illness:   The patient returns today for follow-up evaluation of her carotid stenosis and peripheral arterial disease.  She is currently on dialysis but utilizing peritoneal dialysis.  She denies any issues with this.   No recent shortening of the patient's walking distance or new symptoms consistent with claudication.  No history of rest pain symptoms. No new ulcers or wounds of the lower extremities have occurred.  The patient denies amaurosis fugax or recent TIA symptoms. There are no recent neurological changes noted. There is no history of DVT, PE or superficial thrombophlebitis. No recent episodes of angina or shortness of breath documented.  The patient has noncompressible ABIs bilaterally with strong triphasic tibial artery waveforms bilaterally and good toe waveforms.  This is consistent with previous studies a year ago.  No significant changes  She has 1 to 39% carotid stenosis bilaterally.  Antegrade flow in her bilateral vertebral arteries with normal flow hemodynamics in the subclavian arteries.  Current Meds  Medication Sig   atorvastatin (LIPITOR) 40 MG tablet Take 40 mg by mouth daily.   b complex vitamins capsule Take 1 capsule by mouth daily.   Calcium Carbonate Antacid (CALCIUM CARBONATE PO) Take by mouth.   Cholecalciferol 50 MCG (2000 UT) CAPS Take by mouth.   docusate sodium (COLACE) 100 MG capsule Take by mouth.   ezetimibe (ZETIA) 10 MG tablet Take 10 mg by mouth daily.   ferrous sulfate 324 MG TBEC Take 324 mg by mouth.   furosemide (LASIX) 40 MG tablet Take 40 mg by mouth 2 (two) times daily.   levothyroxine (SYNTHROID) 112 MCG tablet Take 112 mcg by mouth daily before breakfast.   Magnesium 400 MG TABS Take by mouth.   metoprolol succinate  (TOPROL-XL) 25 MG 24 hr tablet Take 12.5 mg by mouth at bedtime.   Multiple Vitamin (MULTIVITAMIN) tablet Take 1 tablet by mouth daily.   OMEGA 3-6-9 FATTY ACIDS PO Take 1 tablet by mouth daily.   omeprazole (PRILOSEC) 20 MG capsule Take 20 mg by mouth daily.   saxagliptin HCl (ONGLYZA) 5 MG TABS tablet Take 5 mg by mouth daily.   STUDY - ASPIRE - aspirin 81 mg or placebo tablet (PI-Sethi) Take 1 tablet by mouth at bedtime.   triamcinolone (NASACORT) 55 MCG/ACT AERO nasal inhaler Place into the nose.   valsartan (DIOVAN) 160 MG tablet Take 80 mg by mouth daily. Taking 0.5 tablet every morning   VITAMIN D PO Take by mouth.   warfarin (COUMADIN) 5 MG tablet Take 5 mg by mouth daily. Take 1 tablet mondays, wednesdays, fridays, and sundays. Take 0.5 tablet tuesdays, thursdays, and saturdays    Past Medical History:  Diagnosis Date   Diabetes mellitus without complication (HCC)    Hypertension    Renal disorder    Thyroid disease     Past Surgical History:  Procedure Laterality Date   CARDIAC SURGERY     CHOLECYSTECTOMY     DIALYSIS/PERMA CATHETER INSERTION  N/A 04/19/2023   Procedure: DIALYSIS/PERMA CATHETER INSERTION;  Surgeon: Annice Needy, MD;  Location: [facility];  Service: Cardiovascular;  Laterality: N/A;   DIALYSIS/PERMA CATHETER REMOVAL N/A 05/20/2023   Procedure: DIALYSIS/PERMA CATHETER REMOVAL;  Surgeon: Annice Needy, MD;  Location: [facility];  Service: Cardiovascular;  Laterality: N/A;   OOPHORECTOMY Left    REPLACEMENT TOTAL KNEE Left     Social History Social History   Tobacco Use   Smoking status: Never   Smokeless tobacco: Never  Vaping Use   Vaping status: Never Used  Substance Use Topics   Alcohol use: Not Currently   Drug use: Never    Family History Family History  Problem Relation Age of Onset   Breast cancer Maternal Aunt     No Known Allergies   REVIEW OF SYSTEMS (Negative unless checked)  Constitutional: [] Weight loss   [] Fever  [] Chills Cardiac: [] Chest pain   [] Chest pressure   [] Palpitations   [] Shortness of breath when laying flat   [] Shortness of breath with exertion. Vascular:  [] Pain in legs with walking   [] Pain in legs at rest  [] History of DVT   [] Phlebitis   [] Swelling in legs   [] Varicose veins   [] Non-healing ulcers Pulmonary:   [] Uses home oxygen   [] Productive cough   [] Hemoptysis   [] Wheeze  [] COPD   [] Asthma Neurologic:  [] Dizziness   [] Seizures   [] History of stroke   [] History of TIA  [] Aphasia   [] Vissual changes   [] Weakness or numbness in arm   [] Weakness or numbness in leg Musculoskeletal:   [] Joint swelling   [] Joint pain   [] Low back pain Hematologic:  [] Easy bruising  [] Easy bleeding   [] Hypercoagulable state   [] Anemic Gastrointestinal:  [] Diarrhea   [] Vomiting  [] Gastroesophageal reflux/heartburn   [] Difficulty swallowing. Genitourinary:  [] Chronic kidney disease   [] Difficult urination  [] Frequent urination   [] Blood in urine Skin:  [] Rashes   [] Ulcers  Psychological:  [] History of anxiety   []  History of major depression.  Physical Examination  Vitals:   11/08/23 1313  BP: 100/61  Pulse: 64  Resp: 18  Weight: 167 lb 12.8 oz (76.1 kg)  Height: 5\' 2"  (1.575 m)   Body mass index is 30.69 kg/m. Gen: WD/WN, NAD Head: [place]/AT, No temporalis wasting.  Ear/Nose/Throat: Hearing grossly intact, nares w/o erythema or drainage Eyes: PER, EOMI, sclera nonicteric.  Neck: Supple, no masses.  No bruit or JVD.  Pulmonary:  Good air movement, no audible wheezing, no use of accessory muscles.  Cardiac: RRR, normal S1, S2, no Murmurs. Vascular:  Vessel Right Left  Radial Palpable Palpable  PT Not Palpable Not Palpable  DP Not Palpable Not Palpable  Gastrointestinal: soft, non-distended. No guarding/no peritoneal signs.  Musculoskeletal: M/S 5/5 throughout.  No visible deformity.  Neurologic: CN 2-12 intact. Pain and light touch intact in extremities.  Symmetrical.  Speech is fluent.  Motor exam as listed above. Psychiatric: Judgment intact, Mood & affect appropriate for pt's clinical situation. Dermatologic: No rashes or ulcers noted.  No changes consistent with cellulitis.   CBC Lab Results  Component Value Date   WBC 9.7 11/05/2021   HGB 11.3 (L) 11/05/2021   HCT 36.2 11/05/2021   MCV 92.8 11/05/2021   PLT 211 11/05/2021    BMET    Component Value Date/Time   NA 140 06/23/2021 1037   K 4.5 06/23/2021 1037   CL 103 06/23/2021 1037   CO2 28 06/23/2021 1037  GLUCOSE 181 (H) 06/23/2021 1037   BUN 63 (H) 06/23/2021 1037   CREATININE 2.11 (H) 06/23/2021 1037   CALCIUM 9.5 06/23/2021 1037   GFRNONAA 25 (L) 06/23/2021 1037   CrCl cannot be calculated (Patient's most recent lab result is older than the maximum 21 days allowed.).  COAG Lab Results  Component Value Date   INR 4.4 (HH) 11/05/2021   INR 2.0 (H) 06/23/2021    Radiology VAS [place] ABI WITH/WO TBI Result Date: 11/08/2023  LOWER EXTREMITY DOPPLER STUDY Patient Name:  Rachel English  Date of Exam:   11/08/2023 Medical Rec #: [id number]        Accession #:    [id number] Date of Birth: 10-03-1955        Patient Gender: F Patient Age:   7 years Exam Location:  [facility] Procedure:      VAS [place] ABI WITH/WO TBI Referring Phys: Levora Dredge --------------------------------------------------------------------------------  Indications: Claudication.  Comparison Study: 10/28/2022 Performing Technologist: Debbe Bales RVS  Examination Guidelines: A complete evaluation includes at minimum, Doppler waveform signals and systolic blood pressure reading at the level of bilateral brachial, anterior tibial, and posterior tibial arteries, when vessel segments are accessible. Bilateral testing is considered an integral part of a complete examination. Photoelectric Plethysmograph (PPG) waveforms and toe systolic pressure readings are included as required and additional duplex testing as needed. Limited  examinations for reoccurring indications may be performed as noted.  ABI Findings: +---------+------------------+-----+---------+--------+ Right    Rt Pressure (mmHg)IndexWaveform Comment  +---------+------------------+-----+---------+--------+ Brachial 113                                      +---------+------------------+-----+---------+--------+ ATA      218               1.93 triphasicNC       +---------+------------------+-----+---------+--------+ PTA      210               1.86 triphasicNC       +---------+------------------+-----+---------+--------+ Great Toe103               0.91 Normal            +---------+------------------+-----+---------+--------+ +---------+------------------+-----+---------+-------+ Left     Lt Pressure (mmHg)IndexWaveform Comment +---------+------------------+-----+---------+-------+ Brachial 113                                     +---------+------------------+-----+---------+-------+ ATA      233               2.06 triphasicNC      +---------+------------------+-----+---------+-------+ PTA      208               1.84 triphasicNC      +---------+------------------+-----+---------+-------+ Great Toe121               1.07 Normal           +---------+------------------+-----+---------+-------+ +-------+-----------+-----------+------------+------------+ ABI/TBIToday's ABIToday's TBIPrevious ABIPrevious TBI +-------+-----------+-----------+------------+------------+ Right  >1.0 [place]    .91        >1.0 [place]     .86          +-------+-----------+-----------+------------+------------+ Left   >1.0 [place]    1.07       >1.0 [place]      1.19         +-------+-----------+-----------+------------+------------+ Bilateral ABIs  appear essentially unchanged compared to prior study on 10/28/2022. Left TBIs appear essentially unchanged compared to prior study on 10/28/2022. Rt TBIs appear to be increased compared to prior study on  10/28/2022.  Summary: Right: Resting right ankle-brachial index indicates noncompressible right lower extremity arteries. The right toe-brachial index is normal. Left: Resting left ankle-brachial index indicates noncompressible left lower extremity arteries. The left toe-brachial index is normal. *See table(s) above for measurements and observations.  Electronically signed by Levora Dredge MD on 11/08/2023 at 4:52:40 PM.    Final    VAS US CAROTID Result Date: 11/08/2023 Carotid Arterial Duplex Study Patient Name:  Rachel English  Date of Exam:   11/08/2023 Medical Rec #: [id number]        Accession #:    [id number] Date of Birth: February 16, 1956        Patient Gender: F Patient Age:   16 years Exam Location:  [facility] Procedure:      VAS US CAROTID Referring Phys: Festus Barren --------------------------------------------------------------------------------  Indications:       Carotid artery disease. Comparison Study:  10/28/2022 Performing Technologist: Debbe Bales RVS  Examination Guidelines: A complete evaluation includes B-mode imaging, spectral Doppler, color Doppler, and power Doppler as needed of all accessible portions of each vessel. Bilateral testing is considered an integral part of a complete examination. Limited examinations for reoccurring indications may be performed as noted.  Right Carotid Findings: +----------+--------+--------+--------+------------------+--------+           PSV cm/sEDV cm/sStenosisPlaque DescriptionComments +----------+--------+--------+--------+------------------+--------+ CCA Prox  60      10                                         +----------+--------+--------+--------+------------------+--------+ CCA Mid   93      13                                         +----------+--------+--------+--------+------------------+--------+ CCA Distal85      16                                          +----------+--------+--------+--------+------------------+--------+ ICA Prox  70      22                                         +----------+--------+--------+--------+------------------+--------+ ICA Mid   84      18                                         +----------+--------+--------+--------+------------------+--------+ ICA Distal55      12                                         +----------+--------+--------+--------+------------------+--------+ ECA       81      0                                          +----------+--------+--------+--------+------------------+--------+ +----------+--------+-------+--------+-------------------+  PSV cm/sEDV cmsDescribeArm Pressure (mmHG) +----------+--------+-------+--------+-------------------+ Subclavian55      0                                  +----------+--------+-------+--------+-------------------+ +---------+--------+--+--------+--+ VertebralPSV cm/s71EDV cm/s11 +---------+--------+--+--------+--+  Left Carotid Findings: +----------+--------+--------+--------+------------------+--------+           PSV cm/sEDV cm/sStenosisPlaque DescriptionComments +----------+--------+--------+--------+------------------+--------+ CCA Prox  82      11                                         +----------+--------+--------+--------+------------------+--------+ CCA Mid   65      15                                         +----------+--------+--------+--------+------------------+--------+ CCA Distal44      11                                         +----------+--------+--------+--------+------------------+--------+ ICA Prox  59      16                                         +----------+--------+--------+--------+------------------+--------+ ICA Mid   86      18                                         +----------+--------+--------+--------+------------------+--------+ ICA Distal65      13                                          +----------+--------+--------+--------+------------------+--------+ ECA       49      0                                          +----------+--------+--------+--------+------------------+--------+ +----------+--------+--------+--------+-------------------+           PSV cm/sEDV cm/sDescribeArm Pressure (mmHG) +----------+--------+--------+--------+-------------------+ [id number]      0                                   +----------+--------+--------+--------+-------------------+ +---------+--------+--+--------+--+ VertebralPSV cm/s81EDV cm/s12 +---------+--------+--+--------+--+   Summary: Right Carotid: Velocities in the right ICA are consistent with a 1-39% stenosis. Left Carotid: Velocities in the left ICA are consistent with a 1-39% stenosis. Vertebrals:  Bilateral vertebral arteries demonstrate antegrade flow. Subclavians: Normal flow hemodynamics were seen in bilateral subclavian              arteries. *See table(s) above for measurements and observations.  Electronically signed by Levora Dredge MD on 11/08/2023 at 4:52:26 PM.    Final      Assessment/Plan 1. PAD (peripheral artery disease) (HCC)  Recommend:  The patient has evidence of  atherosclerosis of the lower extremities with no claudication.  The patient does not voice lifestyle limiting changes at this point in time.  Noninvasive studies do not suggest clinically significant change.  No invasive studies, angiography or surgery at this time The patient should continue walking and begin a more formal exercise program.  The patient should continue antiplatelet therapy and aggressive treatment of the lipid abnormalities  No changes in the patient's medications at this time  Continued surveillance is indicated as atherosclerosis is likely to progress with time.    The patient will continue follow up with noninvasive studies as ordered.   2. Bilateral carotid artery  stenosis Recommend:  Given the patient's asymptomatic subcritical stenosis no further invasive testing or surgery at this time.  Duplex ultrasound shows <40% stenosis bilaterally.  Continue antiplatelet therapy as prescribed Continue management of CAD, HTN and Hyperlipidemia Healthy heart diet,  encouraged exercise at least 4 times per week  Follow up in 12 months with duplex ultrasound and physical exam   3. End stage renal disease (HCC) (Primary) The patient is currently maintained via peritoneal dialysis and doing well  4. Mixed hyperlipidemia Continue statin as ordered and reviewed, no changes at this time    Georgiana Spinner, NP  11/09/2023 12:13 AM

## 2023-11-04 ENCOUNTER — Other Ambulatory Visit (INDEPENDENT_AMBULATORY_CARE_PROVIDER_SITE_OTHER): Payer: Self-pay | Admitting: Vascular Surgery

## 2023-11-04 DIAGNOSIS — R6889 Other general symptoms and signs: Secondary | ICD-10-CM

## 2023-11-04 DIAGNOSIS — I6523 Occlusion and stenosis of bilateral carotid arteries: Secondary | ICD-10-CM

## 2023-11-08 ENCOUNTER — Ambulatory Visit (INDEPENDENT_AMBULATORY_CARE_PROVIDER_SITE_OTHER): Payer: Medicare PPO

## 2023-11-08 ENCOUNTER — Encounter (INDEPENDENT_AMBULATORY_CARE_PROVIDER_SITE_OTHER): Payer: Self-pay | Admitting: Vascular Surgery

## 2023-11-08 ENCOUNTER — Ambulatory Visit (INDEPENDENT_AMBULATORY_CARE_PROVIDER_SITE_OTHER): Payer: Medicare PPO | Admitting: Nurse Practitioner

## 2023-11-08 VITALS — BP 100/61 | HR 64 | Resp 18 | Ht 62.0 in | Wt 167.8 lb

## 2023-11-08 DIAGNOSIS — I739 Peripheral vascular disease, unspecified: Secondary | ICD-10-CM | POA: Diagnosis not present

## 2023-11-08 DIAGNOSIS — I6523 Occlusion and stenosis of bilateral carotid arteries: Secondary | ICD-10-CM

## 2023-11-08 DIAGNOSIS — N186 End stage renal disease: Secondary | ICD-10-CM | POA: Diagnosis not present

## 2023-11-08 DIAGNOSIS — R6889 Other general symptoms and signs: Secondary | ICD-10-CM | POA: Diagnosis not present

## 2023-11-08 DIAGNOSIS — E782 Mixed hyperlipidemia: Secondary | ICD-10-CM

## 2023-11-08 DIAGNOSIS — E039 Hypothyroidism, unspecified: Secondary | ICD-10-CM

## 2023-11-08 LAB — VAS US ABI WITH/WO TBI
Left ABI: >1
Right ABI: >1

## 2023-11-08 NOTE — Progress Notes (Incomplete)
 MRN : [id number]  Rachel English is a 68 y.o. ([birth year]) female who presents with chief complaint of check circulation.  History of Present Illness:    The patient is seen for evaluation of dialysis access.  The patient has a history of multiple failed accesses.  There have been accesses in both arms which are nonfunctioning.    Current access is via a catheter which is functioning poorly the flow rates have been less than ideal.  There have not been multiple episodes of catheter infection.  The patient denies fever and chills while on dialysis.  No tenderness or drainage at the exit site.  No recent shortening of the patient's walking distance or new symptoms consistent with claudication.  No history of rest pain symptoms. No new ulcers or wounds of the lower extremities have occurred.  The patient denies amaurosis fugax or recent TIA symptoms. There are no recent neurological changes noted. There is no history of DVT, PE or superficial thrombophlebitis. No recent episodes of angina or shortness of breath documented.   No outpatient medications have been marked as taking for the 11/08/23 encounter (Appointment) with Gilda Crease, Latina Craver, MD.    Past Medical History:  Diagnosis Date  . Diabetes mellitus without complication (HCC)   . Hypertension   . Renal disorder   . Thyroid disease     Past Surgical History:  Procedure Laterality Date  . CARDIAC SURGERY    . CHOLECYSTECTOMY    . DIALYSIS/PERMA CATHETER INSERTION N/A 04/19/2023   Procedure: DIALYSIS/PERMA CATHETER INSERTION;  Surgeon: Annice Needy, MD;  Location: [facility];  Service: Cardiovascular;  Laterality: N/A;  . DIALYSIS/PERMA CATHETER REMOVAL N/A 05/20/2023   Procedure: DIALYSIS/PERMA CATHETER REMOVAL;  Surgeon: Annice Needy, MD;  Location: [facility];  Service: Cardiovascular;  Laterality: N/A;  . OOPHORECTOMY Left   .  REPLACEMENT TOTAL KNEE Left     Social History Social History   Tobacco Use  . Smoking status: Never  . Smokeless tobacco: Never  Vaping Use  . Vaping status: Never Used  Substance Use Topics  . Alcohol use: Not Currently  . Drug use: Never    Family History Family History  Problem Relation Age of Onset  . Breast cancer Maternal Aunt     No Known Allergies   REVIEW OF SYSTEMS (Negative unless checked)  Constitutional: [] Weight loss  [] Fever  [] Chills Cardiac: [] Chest pain   [] Chest pressure   [] Palpitations   [] Shortness of breath when laying flat   [] Shortness of breath with exertion. Vascular:  [x] Pain in legs with walking   [] Pain in legs at rest  [] History of DVT   [] Phlebitis   [] Swelling in legs   [] Varicose veins   [] Non-healing ulcers Pulmonary:   [] Uses home oxygen   [] Productive cough   [] Hemoptysis   [] Wheeze  [] COPD   [] Asthma Neurologic:  [] Dizziness   [] Seizures   [] History of stroke   [] History of TIA  [] Aphasia   [] Vissual changes   [] Weakness or numbness in arm   [] Weakness or numbness in leg Musculoskeletal:   []   Joint swelling   [] Joint pain   [] Low back pain Hematologic:  [] Easy bruising  [] Easy bleeding   [] Hypercoagulable state   [] Anemic Gastrointestinal:  [] Diarrhea   [] Vomiting  [] Gastroesophageal reflux/heartburn   [] Difficulty swallowing. Genitourinary:  [] Chronic kidney disease   [] Difficult urination  [] Frequent urination   [] Blood in urine Skin:  [] Rashes   [] Ulcers  Psychological:  [] History of anxiety   []  History of major depression.  Physical Examination  There were no vitals filed for this visit. There is no height or weight on file to calculate BMI. Gen: WD/WN, NAD Head: [place]/AT, No temporalis wasting.  Ear/Nose/Throat: Hearing grossly intact, nares w/o erythema or drainage Eyes: PER, EOMI, sclera nonicteric.  Neck: Supple, no masses.  No bruit or JVD.  Pulmonary:  Good air movement, no audible wheezing, no use of accessory muscles.   Cardiac: RRR, normal S1, S2, no Murmurs. Vascular:  mild trophic changes, no open wounds Vessel Right Left  Radial Palpable Palpable  PT Not Palpable Not Palpable  DP Not Palpable Not Palpable  Gastrointestinal: soft, non-distended. No guarding/no peritoneal signs.  Musculoskeletal: M/S 5/5 throughout.  No visible deformity.  Neurologic: CN 2-12 intact. Pain and light touch intact in extremities.  Symmetrical.  Speech is fluent. Motor exam as listed above. Psychiatric: Judgment intact, Mood & affect appropriate for pt's clinical situation. Dermatologic: No rashes or ulcers noted.  No changes consistent with cellulitis.   CBC Lab Results  Component Value Date   WBC 9.7 11/05/2021   HGB 11.3 (L) 11/05/2021   HCT 36.2 11/05/2021   MCV 92.8 11/05/2021   PLT 211 11/05/2021    BMET    Component Value Date/Time   NA 140 06/23/2021 1037   K 4.5 06/23/2021 1037   CL 103 06/23/2021 1037   CO2 28 06/23/2021 1037   GLUCOSE 181 (H) 06/23/2021 1037   BUN 63 (H) 06/23/2021 1037   CREATININE 2.11 (H) 06/23/2021 1037   CALCIUM 9.5 06/23/2021 1037   GFRNONAA 25 (L) 06/23/2021 1037   CrCl cannot be calculated (Patient's most recent lab result is older than the maximum 21 days allowed.).  COAG Lab Results  Component Value Date   INR 4.4 (HH) 11/05/2021   INR 2.0 (H) 06/23/2021    Radiology No results found.   Assessment/Plan 1. PAD (peripheral artery disease) (HCC) ***  2. Bilateral carotid artery stenosis ***  3. End stage renal disease (HCC) (Primary) ***  4. Mixed hyperlipidemia Continue statin as ordered and reviewed, no changes at this time    Levora Dredge, MD  11/03/2023 1:31 PM

## 2024-01-18 ENCOUNTER — Encounter (INDEPENDENT_AMBULATORY_CARE_PROVIDER_SITE_OTHER): Payer: Self-pay

## 2024-06-14 ENCOUNTER — Other Ambulatory Visit: Payer: Self-pay | Admitting: Family Medicine

## 2024-06-14 DIAGNOSIS — Z1231 Encounter for screening mammogram for malignant neoplasm of breast: Secondary | ICD-10-CM

## 2024-07-21 ENCOUNTER — Ambulatory Visit
Admission: RE | Admit: 2024-07-21 | Discharge: 2024-07-21 | Disposition: A | Discharge status: Home / Self Care | Source: Ambulatory Visit | Attending: Family Medicine | Admitting: Family Medicine

## 2024-07-21 DIAGNOSIS — Z1231 Encounter for screening mammogram for malignant neoplasm of breast: Secondary | ICD-10-CM | POA: Insufficient documentation

## 2024-11-09 ENCOUNTER — Encounter (INDEPENDENT_AMBULATORY_CARE_PROVIDER_SITE_OTHER)

## 2024-11-09 ENCOUNTER — Ambulatory Visit (INDEPENDENT_AMBULATORY_CARE_PROVIDER_SITE_OTHER): Admitting: Vascular Surgery
